# Patient Record
Sex: Female | Born: 1975 | Race: White | Hispanic: No | Marital: Single | Smoking: Former smoker
Health system: Southern US, Community
[De-identification: ages and names within clinical notes are randomized; demographics above are authoritative.]

## PROBLEM LIST (undated history)

## (undated) DIAGNOSIS — I739 Peripheral vascular disease, unspecified: Secondary | ICD-10-CM

## (undated) DIAGNOSIS — E785 Hyperlipidemia, unspecified: Secondary | ICD-10-CM

## (undated) DIAGNOSIS — I2510 Atherosclerotic heart disease of native coronary artery without angina pectoris: Secondary | ICD-10-CM

## (undated) DIAGNOSIS — E669 Obesity, unspecified: Secondary | ICD-10-CM

## (undated) DIAGNOSIS — K449 Diaphragmatic hernia without obstruction or gangrene: Secondary | ICD-10-CM

## (undated) DIAGNOSIS — Z9119 Patient's noncompliance with other medical treatment and regimen: Secondary | ICD-10-CM

## (undated) DIAGNOSIS — I6529 Occlusion and stenosis of unspecified carotid artery: Secondary | ICD-10-CM

## (undated) DIAGNOSIS — D649 Anemia, unspecified: Secondary | ICD-10-CM

## (undated) DIAGNOSIS — I10 Essential (primary) hypertension: Secondary | ICD-10-CM

## (undated) DIAGNOSIS — Z91199 Patient's noncompliance with other medical treatment and regimen due to unspecified reason: Secondary | ICD-10-CM

## (undated) DIAGNOSIS — I214 Non-ST elevation (NSTEMI) myocardial infarction: Secondary | ICD-10-CM

## (undated) DIAGNOSIS — K219 Gastro-esophageal reflux disease without esophagitis: Secondary | ICD-10-CM

## (undated) DIAGNOSIS — E119 Type 2 diabetes mellitus without complications: Secondary | ICD-10-CM

## (undated) DIAGNOSIS — T79A29A Traumatic compartment syndrome of unspecified lower extremity, initial encounter: Secondary | ICD-10-CM

## (undated) DIAGNOSIS — K2211 Ulcer of esophagus with bleeding: Secondary | ICD-10-CM

## (undated) HISTORY — DX: Patient's noncompliance with other medical treatment and regimen due to unspecified reason: Z91.199

## (undated) HISTORY — PX: CORONARY ARTERY BYPASS GRAFT: SHX141

## (undated) HISTORY — DX: Anemia, unspecified: D64.9

## (undated) HISTORY — DX: Gastro-esophageal reflux disease without esophagitis: K21.9

## (undated) HISTORY — DX: Peripheral vascular disease, unspecified: I73.9

## (undated) HISTORY — PX: HIP SURGERY: SHX245

## (undated) HISTORY — DX: Patient's noncompliance with other medical treatment and regimen: Z91.19

## (undated) HISTORY — DX: Hyperlipidemia, unspecified: E78.5

## (undated) HISTORY — PX: ABDOMINAL HYSTERECTOMY: SHX81

## (undated) HISTORY — DX: Ulcer of esophagus with bleeding: K22.11

## (undated) HISTORY — DX: Occlusion and stenosis of unspecified carotid artery: I65.29

## (undated) HISTORY — DX: Obesity, unspecified: E66.9

## (undated) HISTORY — DX: Atherosclerotic heart disease of native coronary artery without angina pectoris: I25.10

## (undated) HISTORY — DX: Diaphragmatic hernia without obstruction or gangrene: K44.9

## (undated) HISTORY — DX: Non-ST elevation (NSTEMI) myocardial infarction: I21.4

## (undated) HISTORY — PX: HERNIA REPAIR: SHX51

## (undated) HISTORY — PX: CORONARY ANGIOPLASTY WITH STENT PLACEMENT: SHX49

---

## 2004-10-15 DIAGNOSIS — I739 Peripheral vascular disease, unspecified: Secondary | ICD-10-CM

## 2004-10-15 DIAGNOSIS — I2510 Atherosclerotic heart disease of native coronary artery without angina pectoris: Secondary | ICD-10-CM

## 2004-10-15 HISTORY — DX: Atherosclerotic heart disease of native coronary artery without angina pectoris: I25.10

## 2004-10-15 HISTORY — DX: Peripheral vascular disease, unspecified: I73.9

## 2010-03-14 ENCOUNTER — Ambulatory Visit: Payer: Self-pay | Admitting: Cardiovascular Disease

## 2010-03-17 ENCOUNTER — Telehealth: Payer: Self-pay | Admitting: Cardiovascular Disease

## 2010-03-17 ENCOUNTER — Telehealth (INDEPENDENT_AMBULATORY_CARE_PROVIDER_SITE_OTHER): Payer: Self-pay | Admitting: *Deleted

## 2010-07-18 ENCOUNTER — Ambulatory Visit: Payer: Self-pay | Admitting: Cardiovascular Disease

## 2010-07-19 ENCOUNTER — Telehealth (INDEPENDENT_AMBULATORY_CARE_PROVIDER_SITE_OTHER): Payer: Self-pay | Admitting: Radiology

## 2010-07-19 ENCOUNTER — Telehealth: Payer: Self-pay | Admitting: Cardiovascular Disease

## 2010-07-20 ENCOUNTER — Telehealth (INDEPENDENT_AMBULATORY_CARE_PROVIDER_SITE_OTHER): Payer: Self-pay | Admitting: Radiology

## 2010-07-20 ENCOUNTER — Telehealth: Payer: Self-pay | Admitting: Cardiovascular Disease

## 2010-07-31 ENCOUNTER — Telehealth (INDEPENDENT_AMBULATORY_CARE_PROVIDER_SITE_OTHER): Payer: Self-pay | Admitting: Radiology

## 2010-08-04 ENCOUNTER — Telehealth: Payer: Self-pay | Admitting: Cardiovascular Disease

## 2010-08-22 ENCOUNTER — Telehealth (INDEPENDENT_AMBULATORY_CARE_PROVIDER_SITE_OTHER): Payer: Self-pay | Admitting: *Deleted

## 2010-08-22 ENCOUNTER — Telehealth: Payer: Self-pay | Admitting: Cardiovascular Disease

## 2010-08-23 ENCOUNTER — Telehealth (INDEPENDENT_AMBULATORY_CARE_PROVIDER_SITE_OTHER): Payer: Self-pay

## 2010-09-29 ENCOUNTER — Telehealth (INDEPENDENT_AMBULATORY_CARE_PROVIDER_SITE_OTHER): Payer: Self-pay | Admitting: *Deleted

## 2010-09-29 ENCOUNTER — Telehealth: Payer: Self-pay | Admitting: Cardiovascular Disease

## 2010-09-29 ENCOUNTER — Ambulatory Visit: Payer: Self-pay | Admitting: Cardiovascular Disease

## 2010-10-02 ENCOUNTER — Telehealth: Payer: Self-pay | Admitting: Cardiovascular Disease

## 2010-10-13 ENCOUNTER — Telehealth (INDEPENDENT_AMBULATORY_CARE_PROVIDER_SITE_OTHER): Payer: Self-pay | Admitting: *Deleted

## 2010-10-13 ENCOUNTER — Encounter: Payer: Self-pay | Admitting: Cardiovascular Disease

## 2010-10-13 DIAGNOSIS — R0989 Other specified symptoms and signs involving the circulatory and respiratory systems: Secondary | ICD-10-CM | POA: Insufficient documentation

## 2010-10-15 DIAGNOSIS — I214 Non-ST elevation (NSTEMI) myocardial infarction: Secondary | ICD-10-CM

## 2010-10-15 HISTORY — PX: CARDIAC CATHETERIZATION: SHX172

## 2010-10-15 HISTORY — DX: Non-ST elevation (NSTEMI) myocardial infarction: I21.4

## 2010-10-17 ENCOUNTER — Encounter: Payer: Self-pay | Admitting: Cardiovascular Disease

## 2010-10-17 ENCOUNTER — Inpatient Hospital Stay (HOSPITAL_COMMUNITY)
Admission: EM | Admit: 2010-10-17 | Discharge: 2010-10-29 | Payer: Self-pay | Source: Home / Self Care | Attending: Cardiology | Admitting: Cardiology

## 2010-10-17 ENCOUNTER — Ambulatory Visit: Admission: RE | Admit: 2010-10-17 | Discharge: 2010-10-17 | Payer: Self-pay | Source: Home / Self Care

## 2010-10-17 ENCOUNTER — Ambulatory Visit
Admission: RE | Admit: 2010-10-17 | Discharge: 2010-10-17 | Payer: Self-pay | Source: Home / Self Care | Attending: Cardiology | Admitting: Cardiology

## 2010-10-17 ENCOUNTER — Ambulatory Visit (HOSPITAL_COMMUNITY)
Admission: RE | Admit: 2010-10-17 | Discharge: 2010-10-17 | Payer: Self-pay | Source: Home / Self Care | Attending: Cardiovascular Disease | Admitting: Cardiovascular Disease

## 2010-10-17 ENCOUNTER — Encounter (HOSPITAL_COMMUNITY)
Admission: RE | Admit: 2010-10-17 | Discharge: 2010-11-14 | Payer: Self-pay | Source: Home / Self Care | Attending: Cardiovascular Disease | Admitting: Cardiovascular Disease

## 2010-10-17 ENCOUNTER — Ambulatory Visit (HOSPITAL_COMMUNITY): Admission: RE | Admit: 2010-10-17 | Discharge: 2010-10-17 | Payer: Self-pay | Source: Home / Self Care

## 2010-10-17 ENCOUNTER — Ambulatory Visit (HOSPITAL_COMMUNITY)
Admission: RE | Admit: 2010-10-17 | Discharge: 2010-10-17 | Payer: Self-pay | Source: Home / Self Care | Admitting: Cardiovascular Disease

## 2010-10-17 DIAGNOSIS — F17200 Nicotine dependence, unspecified, uncomplicated: Secondary | ICD-10-CM | POA: Insufficient documentation

## 2010-10-17 DIAGNOSIS — I2510 Atherosclerotic heart disease of native coronary artery without angina pectoris: Secondary | ICD-10-CM | POA: Insufficient documentation

## 2010-10-17 DIAGNOSIS — I739 Peripheral vascular disease, unspecified: Secondary | ICD-10-CM | POA: Insufficient documentation

## 2010-10-17 DIAGNOSIS — E119 Type 2 diabetes mellitus without complications: Secondary | ICD-10-CM | POA: Insufficient documentation

## 2010-10-17 DIAGNOSIS — R079 Chest pain, unspecified: Secondary | ICD-10-CM | POA: Insufficient documentation

## 2010-10-17 DIAGNOSIS — I10 Essential (primary) hypertension: Secondary | ICD-10-CM | POA: Insufficient documentation

## 2010-10-17 DIAGNOSIS — E785 Hyperlipidemia, unspecified: Secondary | ICD-10-CM | POA: Insufficient documentation

## 2010-10-18 LAB — COMPREHENSIVE METABOLIC PANEL
ALT: 19 U/L (ref 0–35)
AST: 31 U/L (ref 0–37)
Albumin: 3.3 g/dL — ABNORMAL LOW (ref 3.5–5.2)
Alkaline Phosphatase: 79 U/L (ref 39–117)
BUN: 6 mg/dL (ref 6–23)
CO2: 27 mEq/L (ref 19–32)
Calcium: 9.2 mg/dL (ref 8.4–10.5)
Chloride: 105 mEq/L (ref 96–112)
Creatinine, Ser: 0.59 mg/dL (ref 0.4–1.2)
GFR calc Af Amer: >60 mL/min (ref >60)
GFR calc non Af Amer: >60 mL/min (ref >60)
Glucose, Bld: 111 mg/dL — ABNORMAL HIGH (ref 70–99)
Potassium: 3.9 mEq/L (ref 3.5–5.1)
Sodium: 140 mEq/L (ref 135–145)
Total Bilirubin: 0.2 mg/dL — ABNORMAL LOW (ref 0.3–1.2)
Total Protein: 6.6 g/dL (ref 6.0–8.3)

## 2010-10-18 LAB — PROTIME-INR
INR: 0.9 (ref 0.00–1.49)
Prothrombin Time: 12.4 seconds (ref 11.6–15.2)

## 2010-10-18 LAB — GLUCOSE, CAPILLARY
Glucose-Capillary: 121 mg/dL — ABNORMAL HIGH (ref 70–99)
Glucose-Capillary: 114 mg/dL — ABNORMAL HIGH (ref 70–99)

## 2010-10-18 LAB — CBC
HCT: 36.8 % (ref 36.0–46.0)
Hemoglobin: 11.6 g/dL — ABNORMAL LOW (ref 12.0–15.0)
MCH: 27.8 pg (ref 26.0–34.0)
MCHC: 31.5 g/dL (ref 30.0–36.0)
MCV: 88.2 fL (ref 78.0–100.0)
Platelets: 221 10*3/uL (ref 150–400)
RBC: 4.17 MIL/uL (ref 3.87–5.11)
RDW: 13.6 % (ref 11.5–15.5)
WBC: 7.5 10*3/uL (ref 4.0–10.5)

## 2010-10-18 LAB — LIPID PANEL
Cholesterol: 129 mg/dL (ref 0–200)
HDL: 38 mg/dL — ABNORMAL LOW (ref >39)
LDL Cholesterol: 23 mg/dL (ref 0–99)
Total CHOL/HDL Ratio: 3.4 RATIO
Triglycerides: 342 mg/dL — ABNORMAL HIGH (ref <150)
VLDL: 68 mg/dL — ABNORMAL HIGH (ref 0–40)

## 2010-10-18 LAB — CARDIAC PANEL(CRET KIN+CKTOT+MB+TROPI)
CK, MB: 6.9 ng/mL (ref 0.3–4.0)
CK, MB: 5.8 ng/mL — ABNORMAL HIGH (ref 0.3–4.0)
Relative Index: 5.4 — ABNORMAL HIGH (ref 0.0–2.5)
Relative Index: 5.2 — ABNORMAL HIGH (ref 0.0–2.5)
Total CK: 127 U/L (ref 7–177)
Total CK: 112 U/L (ref 7–177)
Troponin I: 0.83 ng/mL (ref 0.00–0.06)
Troponin I: 0.63 ng/mL (ref 0.00–0.06)

## 2010-10-18 LAB — HEPARIN LEVEL (UNFRACTIONATED): Heparin Unfractionated: <0.1 IU/mL — ABNORMAL LOW (ref 0.30–0.70)

## 2010-10-18 LAB — TSH: TSH: 2.064 u[IU]/mL (ref 0.350–4.500)

## 2010-10-19 ENCOUNTER — Other Ambulatory Visit: Payer: Self-pay | Admitting: Cardiology

## 2010-10-19 LAB — BASIC METABOLIC PANEL
BUN: 5 mg/dL — ABNORMAL LOW (ref 6–23)
CO2: 28 mEq/L (ref 19–32)
Calcium: 9 mg/dL (ref 8.4–10.5)
Chloride: 107 mEq/L (ref 96–112)
Creatinine, Ser: 0.73 mg/dL (ref 0.4–1.2)
GFR calc Af Amer: >60 mL/min (ref >60)
GFR calc non Af Amer: >60 mL/min (ref >60)
Glucose, Bld: 117 mg/dL — ABNORMAL HIGH (ref 70–99)
Potassium: 3.9 mEq/L (ref 3.5–5.1)
Sodium: 141 mEq/L (ref 135–145)

## 2010-10-19 LAB — CBC
HCT: 35 % — ABNORMAL LOW (ref 36.0–46.0)
Hemoglobin: 11.4 g/dL — ABNORMAL LOW (ref 12.0–15.0)
MCH: 28.9 pg (ref 26.0–34.0)
MCHC: 32.6 g/dL (ref 30.0–36.0)
MCV: 88.6 fL (ref 78.0–100.0)
Platelets: 206 10*3/uL (ref 150–400)
RBC: 3.95 MIL/uL (ref 3.87–5.11)
RDW: 13.5 % (ref 11.5–15.5)
WBC: 8.5 10*3/uL (ref 4.0–10.5)

## 2010-10-19 LAB — GLUCOSE, CAPILLARY
Glucose-Capillary: 95 mg/dL (ref 70–99)
Glucose-Capillary: 148 mg/dL — ABNORMAL HIGH (ref 70–99)
Glucose-Capillary: 141 mg/dL — ABNORMAL HIGH (ref 70–99)
Glucose-Capillary: 117 mg/dL — ABNORMAL HIGH (ref 70–99)

## 2010-10-19 LAB — HEPARIN LEVEL (UNFRACTIONATED): Heparin Unfractionated: <0.1 IU/mL — ABNORMAL LOW (ref 0.30–0.70)

## 2010-10-20 ENCOUNTER — Encounter: Payer: Self-pay | Admitting: Cardiology

## 2010-10-20 ENCOUNTER — Encounter: Payer: Self-pay | Admitting: Thoracic Surgery (Cardiothoracic Vascular Surgery)

## 2010-10-20 LAB — CBC
HCT: 33.6 % — ABNORMAL LOW (ref 36.0–46.0)
Hemoglobin: 10.6 g/dL — ABNORMAL LOW (ref 12.0–15.0)
MCH: 28.2 pg (ref 26.0–34.0)
MCHC: 31.5 g/dL (ref 30.0–36.0)
MCV: 89.4 fL (ref 78.0–100.0)
Platelets: 204 10*3/uL (ref 150–400)
RBC: 3.76 MIL/uL — ABNORMAL LOW (ref 3.87–5.11)
RDW: 13.5 % (ref 11.5–15.5)
WBC: 9.1 10*3/uL (ref 4.0–10.5)

## 2010-10-20 LAB — HEPARIN LEVEL (UNFRACTIONATED): Heparin Unfractionated: <0.1 IU/mL — ABNORMAL LOW (ref 0.30–0.70)

## 2010-10-20 LAB — BASIC METABOLIC PANEL
BUN: 6 mg/dL (ref 6–23)
CO2: 27 mEq/L (ref 19–32)
Calcium: 9.2 mg/dL (ref 8.4–10.5)
Chloride: 102 mEq/L (ref 96–112)
Creatinine, Ser: 0.64 mg/dL (ref 0.4–1.2)
GFR calc Af Amer: >60 mL/min (ref >60)
GFR calc non Af Amer: >60 mL/min (ref >60)
Glucose, Bld: 113 mg/dL — ABNORMAL HIGH (ref 70–99)
Potassium: 3.8 mEq/L (ref 3.5–5.1)
Sodium: 138 mEq/L (ref 135–145)

## 2010-10-20 LAB — GLUCOSE, CAPILLARY
Glucose-Capillary: 138 mg/dL — ABNORMAL HIGH (ref 70–99)
Glucose-Capillary: 164 mg/dL — ABNORMAL HIGH (ref 70–99)
Glucose-Capillary: 114 mg/dL — ABNORMAL HIGH (ref 70–99)
Glucose-Capillary: 99 mg/dL (ref 70–99)
Glucose-Capillary: 142 mg/dL — ABNORMAL HIGH (ref 70–99)
Glucose-Capillary: 146 mg/dL — ABNORMAL HIGH (ref 70–99)

## 2010-10-24 ENCOUNTER — Ambulatory Visit: Admit: 2010-10-24 | Payer: Self-pay | Admitting: Cardiovascular Disease

## 2010-10-30 LAB — POCT I-STAT 3, ART BLOOD GAS (G3+)
Acid-Base Excess: 1 mmol/L (ref 0.0–2.0)
Acid-Base Excess: 5 mmol/L — ABNORMAL HIGH (ref 0.0–2.0)
Acid-base deficit: 1 mmol/L (ref 0.0–2.0)
Acid-base deficit: 15 mmol/L — ABNORMAL HIGH (ref 0.0–2.0)
Acid-base deficit: 4 mmol/L — ABNORMAL HIGH (ref 0.0–2.0)
Acid-base deficit: 4 mmol/L — ABNORMAL HIGH (ref 0.0–2.0)
Acid-base deficit: 2 mmol/L (ref 0.0–2.0)
Acid-base deficit: 8 mmol/L — ABNORMAL HIGH (ref 0.0–2.0)
Acid-base deficit: 1 mmol/L (ref 0.0–2.0)
Acid-base deficit: 5 mmol/L — ABNORMAL HIGH (ref 0.0–2.0)
Bicarbonate: 22.4 mEq/L (ref 20.0–24.0)
Bicarbonate: 14.1 mEq/L — ABNORMAL LOW (ref 20.0–24.0)
Bicarbonate: 21 mEq/L (ref 20.0–24.0)
Bicarbonate: 22.1 mEq/L (ref 20.0–24.0)
Bicarbonate: 23.6 mEq/L (ref 20.0–24.0)
Bicarbonate: 16.2 mEq/L — ABNORMAL LOW (ref 20.0–24.0)
Bicarbonate: 24.8 mEq/L — ABNORMAL HIGH (ref 20.0–24.0)
Bicarbonate: 22.8 mEq/L (ref 20.0–24.0)
Bicarbonate: 18.1 mEq/L — ABNORMAL LOW (ref 20.0–24.0)
Bicarbonate: 27.6 mEq/L — ABNORMAL HIGH (ref 20.0–24.0)
O2 Saturation: 100 %
O2 Saturation: 99 %
O2 Saturation: 100 %
O2 Saturation: 93 %
O2 Saturation: 97 %
O2 Saturation: 98 %
O2 Saturation: 92 %
O2 Saturation: 95 %
O2 Saturation: 94 %
O2 Saturation: 91 %
Patient temperature: 36.8
Patient temperature: 98
Patient temperature: 38.6
Patient temperature: 38.1
Patient temperature: 37.3
Patient temperature: 38
TCO2: 23 mmol/L (ref 0–100)
TCO2: 16 mmol/L (ref 0–100)
TCO2: 22 mmol/L (ref 0–100)
TCO2: 23 mmol/L (ref 0–100)
TCO2: 25 mmol/L (ref 0–100)
TCO2: 17 mmol/L (ref 0–100)
TCO2: 26 mmol/L (ref 0–100)
TCO2: 24 mmol/L (ref 0–100)
TCO2: 19 mmol/L (ref 0–100)
TCO2: 29 mmol/L (ref 0–100)
pCO2 arterial: 32.6 mmHg — ABNORMAL LOW (ref 35.0–45.0)
pCO2 arterial: 49.8 mmHg — ABNORMAL HIGH (ref 35.0–45.0)
pCO2 arterial: 37.2 mmHg (ref 35.0–45.0)
pCO2 arterial: 43.4 mmHg (ref 35.0–45.0)
pCO2 arterial: 43.2 mmHg (ref 35.0–45.0)
pCO2 arterial: 25.4 mmHg — ABNORMAL LOW (ref 35.0–45.0)
pCO2 arterial: 39.8 mmHg (ref 35.0–45.0)
pCO2 arterial: 33.6 mmHg — ABNORMAL LOW (ref 35.0–45.0)
pCO2 arterial: 27.3 mmHg — ABNORMAL LOW (ref 35.0–45.0)
pCO2 arterial: 35.6 mmHg (ref 35.0–45.0)
pH, Arterial: 7.446 — ABNORMAL HIGH (ref 7.350–7.400)
pH, Arterial: 7.06 — CL (ref 7.350–7.400)
pH, Arterial: 7.315 — ABNORMAL LOW (ref 7.350–7.400)
pH, Arterial: 7.359 (ref 7.350–7.400)
pH, Arterial: 7.345 — ABNORMAL LOW (ref 7.350–7.400)
pH, Arterial: 7.41 — ABNORMAL HIGH (ref 7.350–7.400)
pH, Arterial: 7.409 — ABNORMAL HIGH (ref 7.350–7.400)
pH, Arterial: 7.444 — ABNORMAL HIGH (ref 7.350–7.400)
pH, Arterial: 7.431 — ABNORMAL HIGH (ref 7.350–7.400)
pH, Arterial: 7.502 — ABNORMAL HIGH (ref 7.350–7.400)
pO2, Arterial: 345 mmHg — ABNORMAL HIGH (ref 80.0–100.0)
pO2, Arterial: 177 mmHg — ABNORMAL HIGH (ref 80.0–100.0)
pO2, Arterial: 392 mmHg — ABNORMAL HIGH (ref 80.0–100.0)
pO2, Arterial: 71 mmHg — ABNORMAL LOW (ref 80.0–100.0)
pO2, Arterial: 92 mmHg (ref 80.0–100.0)
pO2, Arterial: 100 mmHg (ref 80.0–100.0)
pO2, Arterial: 70 mmHg — ABNORMAL LOW (ref 80.0–100.0)
pO2, Arterial: 78 mmHg — ABNORMAL LOW (ref 80.0–100.0)
pO2, Arterial: 66 mmHg — ABNORMAL LOW (ref 80.0–100.0)
pO2, Arterial: 58 mmHg — ABNORMAL LOW (ref 80.0–100.0)

## 2010-10-30 LAB — BASIC METABOLIC PANEL
BUN: 8 mg/dL (ref 6–23)
BUN: 14 mg/dL (ref 6–23)
BUN: 8 mg/dL (ref 6–23)
BUN: 12 mg/dL (ref 6–23)
BUN: 14 mg/dL (ref 6–23)
CO2: 29 mEq/L (ref 19–32)
CO2: 25 mEq/L (ref 19–32)
CO2: 23 mEq/L (ref 19–32)
CO2: 30 mEq/L (ref 19–32)
CO2: 28 mEq/L (ref 19–32)
Calcium: 9.6 mg/dL (ref 8.4–10.5)
Calcium: 9.5 mg/dL (ref 8.4–10.5)
Calcium: 8.5 mg/dL (ref 8.4–10.5)
Calcium: 8.5 mg/dL (ref 8.4–10.5)
Calcium: 8.9 mg/dL (ref 8.4–10.5)
Chloride: 97 mEq/L (ref 96–112)
Chloride: 99 mEq/L (ref 96–112)
Chloride: 104 mEq/L (ref 96–112)
Chloride: 102 mEq/L (ref 96–112)
Chloride: 100 mEq/L (ref 96–112)
Creatinine, Ser: 0.65 mg/dL (ref 0.4–1.2)
Creatinine, Ser: 0.64 mg/dL (ref 0.4–1.2)
Creatinine, Ser: 0.69 mg/dL (ref 0.4–1.2)
Creatinine, Ser: 0.65 mg/dL (ref 0.4–1.2)
Creatinine, Ser: 0.69 mg/dL (ref 0.4–1.2)
GFR calc Af Amer: >60 mL/min (ref >60)
GFR calc Af Amer: >60 mL/min (ref >60)
GFR calc Af Amer: >60 mL/min (ref >60)
GFR calc Af Amer: >60 mL/min (ref >60)
GFR calc Af Amer: >60 mL/min (ref >60)
GFR calc non Af Amer: >60 mL/min (ref >60)
GFR calc non Af Amer: >60 mL/min (ref >60)
GFR calc non Af Amer: >60 mL/min (ref >60)
GFR calc non Af Amer: >60 mL/min (ref >60)
GFR calc non Af Amer: >60 mL/min (ref >60)
Glucose, Bld: 153 mg/dL — ABNORMAL HIGH (ref 70–99)
Glucose, Bld: 178 mg/dL — ABNORMAL HIGH (ref 70–99)
Glucose, Bld: 205 mg/dL — ABNORMAL HIGH (ref 70–99)
Glucose, Bld: 113 mg/dL — ABNORMAL HIGH (ref 70–99)
Glucose, Bld: 79 mg/dL (ref 70–99)
Potassium: 3.8 mEq/L (ref 3.5–5.1)
Potassium: 5 mEq/L (ref 3.5–5.1)
Potassium: 3.9 mEq/L (ref 3.5–5.1)
Potassium: 3.5 mEq/L (ref 3.5–5.1)
Potassium: 4 mEq/L (ref 3.5–5.1)
Sodium: 137 mEq/L (ref 135–145)
Sodium: 134 mEq/L — ABNORMAL LOW (ref 135–145)
Sodium: 136 mEq/L (ref 135–145)
Sodium: 140 mEq/L (ref 135–145)
Sodium: 138 mEq/L (ref 135–145)

## 2010-10-30 LAB — GLUCOSE, CAPILLARY
Glucose-Capillary: 159 mg/dL — ABNORMAL HIGH (ref 70–99)
Glucose-Capillary: 121 mg/dL — ABNORMAL HIGH (ref 70–99)
Glucose-Capillary: 138 mg/dL — ABNORMAL HIGH (ref 70–99)
Glucose-Capillary: 155 mg/dL — ABNORMAL HIGH (ref 70–99)
Glucose-Capillary: 114 mg/dL — ABNORMAL HIGH (ref 70–99)
Glucose-Capillary: 124 mg/dL — ABNORMAL HIGH (ref 70–99)
Glucose-Capillary: 145 mg/dL — ABNORMAL HIGH (ref 70–99)
Glucose-Capillary: 166 mg/dL — ABNORMAL HIGH (ref 70–99)
Glucose-Capillary: 104 mg/dL — ABNORMAL HIGH (ref 70–99)
Glucose-Capillary: 117 mg/dL — ABNORMAL HIGH (ref 70–99)
Glucose-Capillary: 127 mg/dL — ABNORMAL HIGH (ref 70–99)
Glucose-Capillary: 142 mg/dL — ABNORMAL HIGH (ref 70–99)
Glucose-Capillary: 142 mg/dL — ABNORMAL HIGH (ref 70–99)
Glucose-Capillary: 149 mg/dL — ABNORMAL HIGH (ref 70–99)
Glucose-Capillary: 156 mg/dL — ABNORMAL HIGH (ref 70–99)
Glucose-Capillary: 168 mg/dL — ABNORMAL HIGH (ref 70–99)
Glucose-Capillary: 190 mg/dL — ABNORMAL HIGH (ref 70–99)
Glucose-Capillary: 201 mg/dL — ABNORMAL HIGH (ref 70–99)
Glucose-Capillary: 204 mg/dL — ABNORMAL HIGH (ref 70–99)
Glucose-Capillary: 207 mg/dL — ABNORMAL HIGH (ref 70–99)
Glucose-Capillary: 81 mg/dL (ref 70–99)
Glucose-Capillary: 110 mg/dL — ABNORMAL HIGH (ref 70–99)
Glucose-Capillary: 124 mg/dL — ABNORMAL HIGH (ref 70–99)
Glucose-Capillary: 130 mg/dL — ABNORMAL HIGH (ref 70–99)
Glucose-Capillary: 144 mg/dL — ABNORMAL HIGH (ref 70–99)
Glucose-Capillary: 152 mg/dL — ABNORMAL HIGH (ref 70–99)
Glucose-Capillary: 160 mg/dL — ABNORMAL HIGH (ref 70–99)
Glucose-Capillary: 179 mg/dL — ABNORMAL HIGH (ref 70–99)
Glucose-Capillary: 192 mg/dL — ABNORMAL HIGH (ref 70–99)
Glucose-Capillary: 195 mg/dL — ABNORMAL HIGH (ref 70–99)
Glucose-Capillary: 196 mg/dL — ABNORMAL HIGH (ref 70–99)
Glucose-Capillary: 201 mg/dL — ABNORMAL HIGH (ref 70–99)
Glucose-Capillary: 204 mg/dL — ABNORMAL HIGH (ref 70–99)
Glucose-Capillary: 209 mg/dL — ABNORMAL HIGH (ref 70–99)
Glucose-Capillary: 211 mg/dL — ABNORMAL HIGH (ref 70–99)
Glucose-Capillary: 110 mg/dL — ABNORMAL HIGH (ref 70–99)
Glucose-Capillary: 136 mg/dL — ABNORMAL HIGH (ref 70–99)
Glucose-Capillary: 138 mg/dL — ABNORMAL HIGH (ref 70–99)
Glucose-Capillary: 147 mg/dL — ABNORMAL HIGH (ref 70–99)
Glucose-Capillary: 78 mg/dL (ref 70–99)
Glucose-Capillary: 87 mg/dL (ref 70–99)
Glucose-Capillary: 92 mg/dL (ref 70–99)
Glucose-Capillary: 93 mg/dL (ref 70–99)
Glucose-Capillary: 93 mg/dL (ref 70–99)
Glucose-Capillary: 98 mg/dL (ref 70–99)
Glucose-Capillary: 127 mg/dL — ABNORMAL HIGH (ref 70–99)

## 2010-10-30 LAB — CBC
HCT: 33.2 % — ABNORMAL LOW (ref 36.0–46.0)
HCT: 36 % (ref 36.0–46.0)
HCT: 37.7 % (ref 36.0–46.0)
HCT: 36.8 % (ref 36.0–46.0)
HCT: 31.2 % — ABNORMAL LOW (ref 36.0–46.0)
HCT: 26.7 % — ABNORMAL LOW (ref 36.0–46.0)
HCT: 26.9 % — ABNORMAL LOW (ref 36.0–46.0)
HCT: 23.8 % — ABNORMAL LOW (ref 36.0–46.0)
HCT: 20.4 % — ABNORMAL LOW (ref 36.0–46.0)
HCT: 21.1 % — ABNORMAL LOW (ref 36.0–46.0)
HCT: 25.6 % — ABNORMAL LOW (ref 36.0–46.0)
Hemoglobin: 10.8 g/dL — ABNORMAL LOW (ref 12.0–15.0)
Hemoglobin: 11.5 g/dL — ABNORMAL LOW (ref 12.0–15.0)
Hemoglobin: 12.2 g/dL (ref 12.0–15.0)
Hemoglobin: 11.9 g/dL — ABNORMAL LOW (ref 12.0–15.0)
Hemoglobin: 10.7 g/dL — ABNORMAL LOW (ref 12.0–15.0)
Hemoglobin: 9.3 g/dL — ABNORMAL LOW (ref 12.0–15.0)
Hemoglobin: 9.3 g/dL — ABNORMAL LOW (ref 12.0–15.0)
Hemoglobin: 8.3 g/dL — ABNORMAL LOW (ref 12.0–15.0)
Hemoglobin: 7.1 g/dL — ABNORMAL LOW (ref 12.0–15.0)
Hemoglobin: 7.2 g/dL — ABNORMAL LOW (ref 12.0–15.0)
Hemoglobin: 8.8 g/dL — ABNORMAL LOW (ref 12.0–15.0)
MCH: 28.6 pg (ref 26.0–34.0)
MCH: 28 pg (ref 26.0–34.0)
MCH: 28.2 pg (ref 26.0–34.0)
MCH: 28.2 pg (ref 26.0–34.0)
MCH: 30.1 pg (ref 26.0–34.0)
MCH: 30 pg (ref 26.0–34.0)
MCH: 29.9 pg (ref 26.0–34.0)
MCH: 30.1 pg (ref 26.0–34.0)
MCH: 30 pg (ref 26.0–34.0)
MCH: 30.1 pg (ref 26.0–34.0)
MCH: 30.7 pg (ref 26.0–34.0)
MCHC: 32.5 g/dL (ref 30.0–36.0)
MCHC: 31.9 g/dL (ref 30.0–36.0)
MCHC: 32.4 g/dL (ref 30.0–36.0)
MCHC: 32.3 g/dL (ref 30.0–36.0)
MCHC: 34.3 g/dL (ref 30.0–36.0)
MCHC: 34.8 g/dL (ref 30.0–36.0)
MCHC: 34.6 g/dL (ref 30.0–36.0)
MCHC: 34.9 g/dL (ref 30.0–36.0)
MCHC: 34.8 g/dL (ref 30.0–36.0)
MCHC: 34.1 g/dL (ref 30.0–36.0)
MCHC: 34.4 g/dL (ref 30.0–36.0)
MCV: 87.8 fL (ref 78.0–100.0)
MCV: 87.6 fL (ref 78.0–100.0)
MCV: 87.3 fL (ref 78.0–100.0)
MCV: 87.2 fL (ref 78.0–100.0)
MCV: 87.6 fL (ref 78.0–100.0)
MCV: 86.1 fL (ref 78.0–100.0)
MCV: 86.5 fL (ref 78.0–100.0)
MCV: 86.2 fL (ref 78.0–100.0)
MCV: 86.1 fL (ref 78.0–100.0)
MCV: 88.3 fL (ref 78.0–100.0)
MCV: 89.2 fL (ref 78.0–100.0)
Platelets: 208 10*3/uL (ref 150–400)
Platelets: 259 10*3/uL (ref 150–400)
Platelets: 282 10*3/uL (ref 150–400)
Platelets: 297 10*3/uL (ref 150–400)
Platelets: 119 10*3/uL — ABNORMAL LOW (ref 150–400)
Platelets: 139 10*3/uL — ABNORMAL LOW (ref 150–400)
Platelets: 149 10*3/uL — ABNORMAL LOW (ref 150–400)
Platelets: 152 10*3/uL (ref 150–400)
Platelets: 139 10*3/uL — ABNORMAL LOW (ref 150–400)
Platelets: 177 10*3/uL (ref 150–400)
Platelets: 240 10*3/uL (ref 150–400)
RBC: 3.78 MIL/uL — ABNORMAL LOW (ref 3.87–5.11)
RBC: 4.11 MIL/uL (ref 3.87–5.11)
RBC: 4.32 MIL/uL (ref 3.87–5.11)
RBC: 4.22 MIL/uL (ref 3.87–5.11)
RBC: 3.56 MIL/uL — ABNORMAL LOW (ref 3.87–5.11)
RBC: 3.1 MIL/uL — ABNORMAL LOW (ref 3.87–5.11)
RBC: 3.11 MIL/uL — ABNORMAL LOW (ref 3.87–5.11)
RBC: 2.76 MIL/uL — ABNORMAL LOW (ref 3.87–5.11)
RBC: 2.37 MIL/uL — ABNORMAL LOW (ref 3.87–5.11)
RBC: 2.39 MIL/uL — ABNORMAL LOW (ref 3.87–5.11)
RBC: 2.87 MIL/uL — ABNORMAL LOW (ref 3.87–5.11)
RDW: 13.3 % (ref 11.5–15.5)
RDW: 13.1 % (ref 11.5–15.5)
RDW: 13.1 % (ref 11.5–15.5)
RDW: 13 % (ref 11.5–15.5)
RDW: 13.1 % (ref 11.5–15.5)
RDW: 13.5 % (ref 11.5–15.5)
RDW: 13.7 % (ref 11.5–15.5)
RDW: 13.8 % (ref 11.5–15.5)
RDW: 13.7 % (ref 11.5–15.5)
RDW: 13.8 % (ref 11.5–15.5)
RDW: 14 % (ref 11.5–15.5)
WBC: 9.2 10*3/uL (ref 4.0–10.5)
WBC: 9.7 10*3/uL (ref 4.0–10.5)
WBC: 11.7 10*3/uL — ABNORMAL HIGH (ref 4.0–10.5)
WBC: 11.1 10*3/uL — ABNORMAL HIGH (ref 4.0–10.5)
WBC: 11.8 10*3/uL — ABNORMAL HIGH (ref 4.0–10.5)
WBC: 9.8 10*3/uL (ref 4.0–10.5)
WBC: 11.6 10*3/uL — ABNORMAL HIGH (ref 4.0–10.5)
WBC: 14.5 10*3/uL — ABNORMAL HIGH (ref 4.0–10.5)
WBC: 16.8 10*3/uL — ABNORMAL HIGH (ref 4.0–10.5)
WBC: 12.8 10*3/uL — ABNORMAL HIGH (ref 4.0–10.5)
WBC: 11.7 10*3/uL — ABNORMAL HIGH (ref 4.0–10.5)

## 2010-10-30 LAB — POCT I-STAT 4, (NA,K, GLUC, HGB,HCT)
Glucose, Bld: 140 mg/dL — ABNORMAL HIGH (ref 70–99)
Glucose, Bld: 127 mg/dL — ABNORMAL HIGH (ref 70–99)
Glucose, Bld: 120 mg/dL — ABNORMAL HIGH (ref 70–99)
Glucose, Bld: 195 mg/dL — ABNORMAL HIGH (ref 70–99)
Glucose, Bld: 206 mg/dL — ABNORMAL HIGH (ref 70–99)
Glucose, Bld: 264 mg/dL — ABNORMAL HIGH (ref 70–99)
Glucose, Bld: 277 mg/dL — ABNORMAL HIGH (ref 70–99)
Glucose, Bld: 308 mg/dL — ABNORMAL HIGH (ref 70–99)
Glucose, Bld: 237 mg/dL — ABNORMAL HIGH (ref 70–99)
HCT: 32 % — ABNORMAL LOW (ref 36.0–46.0)
HCT: 27 % — ABNORMAL LOW (ref 36.0–46.0)
HCT: 17 % — ABNORMAL LOW (ref 36.0–46.0)
HCT: 23 % — ABNORMAL LOW (ref 36.0–46.0)
HCT: 26 % — ABNORMAL LOW (ref 36.0–46.0)
HCT: 20 % — ABNORMAL LOW (ref 36.0–46.0)
HCT: 33 % — ABNORMAL LOW (ref 36.0–46.0)
HCT: 35 % — ABNORMAL LOW (ref 36.0–46.0)
HCT: 32 % — ABNORMAL LOW (ref 36.0–46.0)
Hemoglobin: 10.9 g/dL — ABNORMAL LOW (ref 12.0–15.0)
Hemoglobin: 9.2 g/dL — ABNORMAL LOW (ref 12.0–15.0)
Hemoglobin: 5.8 g/dL — CL (ref 12.0–15.0)
Hemoglobin: 7.8 g/dL — ABNORMAL LOW (ref 12.0–15.0)
Hemoglobin: 8.8 g/dL — ABNORMAL LOW (ref 12.0–15.0)
Hemoglobin: 6.8 g/dL — CL (ref 12.0–15.0)
Hemoglobin: 11.2 g/dL — ABNORMAL LOW (ref 12.0–15.0)
Hemoglobin: 11.9 g/dL — ABNORMAL LOW (ref 12.0–15.0)
Hemoglobin: 10.9 g/dL — ABNORMAL LOW (ref 12.0–15.0)
Potassium: 4.2 mEq/L (ref 3.5–5.1)
Potassium: 4.1 mEq/L (ref 3.5–5.1)
Potassium: 3.8 mEq/L (ref 3.5–5.1)
Potassium: 5.5 mEq/L — ABNORMAL HIGH (ref 3.5–5.1)
Potassium: 5.6 mEq/L — ABNORMAL HIGH (ref 3.5–5.1)
Potassium: 4.7 mEq/L (ref 3.5–5.1)
Potassium: 3.8 mEq/L (ref 3.5–5.1)
Potassium: 3.9 mEq/L (ref 3.5–5.1)
Potassium: 3.5 mEq/L (ref 3.5–5.1)
Sodium: 136 mEq/L (ref 135–145)
Sodium: 136 mEq/L (ref 135–145)
Sodium: 129 mEq/L — ABNORMAL LOW (ref 135–145)
Sodium: 130 mEq/L — ABNORMAL LOW (ref 135–145)
Sodium: 132 mEq/L — ABNORMAL LOW (ref 135–145)
Sodium: 160 mEq/L — ABNORMAL HIGH (ref 135–145)
Sodium: 139 mEq/L (ref 135–145)
Sodium: 139 mEq/L (ref 135–145)
Sodium: 141 mEq/L (ref 135–145)

## 2010-10-30 LAB — COMPREHENSIVE METABOLIC PANEL
ALT: 16 U/L (ref 0–35)
ALT: 12 U/L (ref 0–35)
AST: 23 U/L (ref 0–37)
AST: 27 U/L (ref 0–37)
Albumin: 3.5 g/dL (ref 3.5–5.2)
Albumin: 2.9 g/dL — ABNORMAL LOW (ref 3.5–5.2)
Alkaline Phosphatase: 85 U/L (ref 39–117)
Alkaline Phosphatase: 34 U/L — ABNORMAL LOW (ref 39–117)
BUN: 6 mg/dL (ref 6–23)
BUN: 11 mg/dL (ref 6–23)
CO2: 28 mEq/L (ref 19–32)
CO2: 29 mEq/L (ref 19–32)
Calcium: 9.8 mg/dL (ref 8.4–10.5)
Calcium: 8.7 mg/dL (ref 8.4–10.5)
Chloride: 97 mEq/L (ref 96–112)
Chloride: 100 mEq/L (ref 96–112)
Creatinine, Ser: 0.64 mg/dL (ref 0.4–1.2)
Creatinine, Ser: 0.71 mg/dL (ref 0.4–1.2)
GFR calc Af Amer: >60 mL/min (ref >60)
GFR calc Af Amer: >60 mL/min (ref >60)
GFR calc non Af Amer: >60 mL/min (ref >60)
GFR calc non Af Amer: >60 mL/min (ref >60)
Glucose, Bld: 153 mg/dL — ABNORMAL HIGH (ref 70–99)
Glucose, Bld: 151 mg/dL — ABNORMAL HIGH (ref 70–99)
Potassium: 4.5 mEq/L (ref 3.5–5.1)
Potassium: 3.5 mEq/L (ref 3.5–5.1)
Sodium: 135 mEq/L (ref 135–145)
Sodium: 138 mEq/L (ref 135–145)
Total Bilirubin: 0.4 mg/dL (ref 0.3–1.2)
Total Bilirubin: 0.3 mg/dL (ref 0.3–1.2)
Total Protein: 7.6 g/dL (ref 6.0–8.3)
Total Protein: 5.4 g/dL — ABNORMAL LOW (ref 6.0–8.3)

## 2010-10-30 LAB — TYPE AND SCREEN
ABO/RH(D): A POS
Antibody Screen: NEGATIVE
Unit division: 0
Unit division: 0
Unit division: 0
Unit division: 0
Unit division: 0
Unit division: 0
Unit division: 0
Unit division: 0
Unit division: 0
Unit division: 0

## 2010-10-30 LAB — POCT I-STAT, CHEM 8
BUN: 8 mg/dL (ref 6–23)
BUN: 7 mg/dL (ref 6–23)
Calcium, Ion: 1.17 mmol/L (ref 1.12–1.32)
Calcium, Ion: 1.12 mmol/L (ref 1.12–1.32)
Chloride: 106 mEq/L (ref 96–112)
Chloride: 98 mEq/L (ref 96–112)
Creatinine, Ser: 0.5 mg/dL (ref 0.4–1.2)
Creatinine, Ser: 0.6 mg/dL (ref 0.4–1.2)
Glucose, Bld: 119 mg/dL — ABNORMAL HIGH (ref 70–99)
Glucose, Bld: 226 mg/dL — ABNORMAL HIGH (ref 70–99)
HCT: 27 % — ABNORMAL LOW (ref 36.0–46.0)
HCT: 24 % — ABNORMAL LOW (ref 36.0–46.0)
Hemoglobin: 9.2 g/dL — ABNORMAL LOW (ref 12.0–15.0)
Hemoglobin: 8.2 g/dL — ABNORMAL LOW (ref 12.0–15.0)
Potassium: 3.5 mEq/L (ref 3.5–5.1)
Potassium: 3 mEq/L — ABNORMAL LOW (ref 3.5–5.1)
Sodium: 140 mEq/L (ref 135–145)
Sodium: 139 mEq/L (ref 135–145)
TCO2: 24 mmol/L (ref 0–100)
TCO2: 27 mmol/L (ref 0–100)

## 2010-10-30 LAB — PROTIME-INR
INR: 0.92 (ref 0.00–1.49)
INR: 1.46 (ref 0.00–1.49)
Prothrombin Time: 12.6 seconds (ref 11.6–15.2)
Prothrombin Time: 17.9 seconds — ABNORMAL HIGH (ref 11.6–15.2)

## 2010-10-30 LAB — PREPARE FRESH FROZEN PLASMA
Unit division: 0
Unit division: 0

## 2010-10-30 LAB — BLOOD GAS, ARTERIAL
Acid-Base Excess: 4.1 mmol/L — ABNORMAL HIGH (ref 0.0–2.0)
Bicarbonate: 28.6 mEq/L — ABNORMAL HIGH (ref 20.0–24.0)
Drawn by: 205171
FIO2: 0.21 %
O2 Saturation: 92 %
Patient temperature: 98.6
TCO2: 30 mmol/L (ref 0–100)
pCO2 arterial: 46.5 mmHg — ABNORMAL HIGH (ref 35.0–45.0)
pH, Arterial: 7.406 — ABNORMAL HIGH (ref 7.350–7.400)
pO2, Arterial: 68 mmHg — ABNORMAL LOW (ref 80.0–100.0)

## 2010-10-30 LAB — URINALYSIS, ROUTINE W REFLEX MICROSCOPIC
Bilirubin Urine: NEGATIVE
Hgb urine dipstick: NEGATIVE
Ketones, ur: NEGATIVE mg/dL
Nitrite: NEGATIVE
Protein, ur: NEGATIVE mg/dL
Specific Gravity, Urine: 1.011 (ref 1.005–1.030)
Urine Glucose, Fasting: NEGATIVE mg/dL
Urobilinogen, UA: 0.2 mg/dL (ref 0.0–1.0)
pH: 7.5 (ref 5.0–8.0)

## 2010-10-30 LAB — PREPARE PLATELETS
Unit division: 0
Unit division: 0

## 2010-10-30 LAB — CROSSMATCH
ABO/RH(D): A POS
Antibody Screen: NEGATIVE
Unit division: 0
Unit division: 0

## 2010-10-30 LAB — ABO/RH: ABO/RH(D): A POS

## 2010-10-30 LAB — MAGNESIUM
Magnesium: 2.2 mg/dL (ref 1.5–2.5)
Magnesium: 1.9 mg/dL (ref 1.5–2.5)
Magnesium: 1.7 mg/dL (ref 1.5–2.5)

## 2010-10-30 LAB — HEMOGLOBIN A1C
Hgb A1c MFr Bld: 6.6 % — ABNORMAL HIGH (ref <5.7)
Mean Plasma Glucose: 143 mg/dL — ABNORMAL HIGH (ref <117)

## 2010-10-30 LAB — HEPARIN LEVEL (UNFRACTIONATED): Heparin Unfractionated: <0.1 IU/mL — ABNORMAL LOW (ref 0.30–0.70)

## 2010-10-30 LAB — CREATININE, SERUM
Creatinine, Ser: 0.6 mg/dL (ref 0.4–1.2)
Creatinine, Ser: 0.66 mg/dL (ref 0.4–1.2)
GFR calc Af Amer: >60 mL/min (ref >60)
GFR calc Af Amer: >60 mL/min (ref >60)
GFR calc non Af Amer: >60 mL/min (ref >60)
GFR calc non Af Amer: >60 mL/min (ref >60)

## 2010-10-30 LAB — PLATELET INHIBITION P2Y12
P2Y12 % Inhibition: 0 %
Platelet Function  P2Y12: 261 [PRU] (ref 194–418)
Platelet Function Baseline: 250 [PRU] (ref 194–418)

## 2010-10-30 LAB — PLATELET COUNT: Platelets: 172 10*3/uL (ref 150–400)

## 2010-10-30 LAB — APTT
aPTT: 27 seconds (ref 24–37)
aPTT: 35 seconds (ref 24–37)

## 2010-10-30 LAB — HEMOGLOBIN AND HEMATOCRIT, BLOOD
HCT: 25.8 % — ABNORMAL LOW (ref 36.0–46.0)
Hemoglobin: 8.6 g/dL — ABNORMAL LOW (ref 12.0–15.0)

## 2010-10-30 LAB — GLUCOSE, POCT (MANUAL RESULT ENTRY)
Glucose, Bld: 124 mg/dL — ABNORMAL HIGH (ref 70–99)
Operator id: 125961

## 2010-10-30 LAB — PREPARE RBC (CROSSMATCH)

## 2010-11-01 LAB — POCT I-STAT 7, (LYTES, BLD GAS, ICA,H+H)
Acid-base deficit: 3 mmol/L — ABNORMAL HIGH (ref 0.0–2.0)
Bicarbonate: 22.1 mEq/L (ref 20.0–24.0)
Calcium, Ion: 1.21 mmol/L (ref 1.12–1.32)
HCT: 30 % — ABNORMAL LOW (ref 36.0–46.0)
Hemoglobin: 10.2 g/dL — ABNORMAL LOW (ref 12.0–15.0)
O2 Saturation: 99 %
Patient temperature: 36.6
Potassium: 3.4 mEq/L — ABNORMAL LOW (ref 3.5–5.1)
Sodium: 141 mEq/L (ref 135–145)
TCO2: 23 mmol/L (ref 0–100)
pCO2 arterial: 39.6 mmHg (ref 35.0–45.0)
pH, Arterial: 7.353 (ref 7.350–7.400)
pO2, Arterial: 144 mmHg — ABNORMAL HIGH (ref 80.0–100.0)

## 2010-11-01 LAB — GLUCOSE, POCT (MANUAL RESULT ENTRY)
Glucose, Bld: 249 mg/dL — ABNORMAL HIGH (ref 70–99)
Operator id: 238831

## 2010-11-06 ENCOUNTER — Ambulatory Visit
Admission: RE | Admit: 2010-11-06 | Discharge: 2010-11-06 | Payer: Self-pay | Source: Home / Self Care | Attending: Thoracic Surgery (Cardiothoracic Vascular Surgery) | Admitting: Thoracic Surgery (Cardiothoracic Vascular Surgery)

## 2010-11-10 NOTE — Procedures (Signed)
Ann Hawkins, Ann Hawkins                  ACCOUNT NO.:  [id number]  MEDICAL RECORD NO.:  [id number]          PATIENT TYPE:  OUT  LOCATION:  NINV                         FACILITY:  [facility]  PHYSICIAN:  Lorine Bears, MD     DATE OF BIRTH:  1976-04-21  DATE OF PROCEDURE:  10/17/2009 DATE OF DISCHARGE:  10/17/2010                           CARDIAC CATHETERIZATION   PROCEDURES PERFORMED: 1. Left heart catheterization. 2. Coronary angiography. 3. Left ventricular angiography. 4. SVG angiography. 5. LIMA angiography.  INDICATIONS AND CLINICAL HISTORY:  Ms. Zertuche is very complicated 35 year old female with known history of coronary artery disease status post coronary artery bypass graft surgery in 2006 with multiple subsequent angioplasty and stent placements.  She also has peripheral vascular disease status post aortobifemoral bypass and possibly fem-pop bypass bilaterally as well.  Her other risk factors include prolonged history of diabetes, hyperlipidemia, and tobacco use.  She was seen in the outpatient clinic for chest pain, and missed few appointments for her stress test.  She finally made her arranged appointment for nuclear stress test, but started having chest pain during the procedure.  Stress test showed evidence of inferolateral ischemia.  Due to her continued symptoms of chest pain, it was decided to admit her to the hospital. She subsequently ruled in for a small non-ST elevation myocardial infarction.  Risks, benefits, and alternatives were discussed with the patient.  This was known to be a very complex vascular case.  STUDY DETAILS:  Initially, I attempted to go through the right femoral artery:  The area was anesthetized with 1% lidocaine.  The pulse was very faint.  I was able to get inside the artery.  However, the wire did not pass to the distal aorta.  I placed the sheath at that time and performed angiography, which showed that possibly we were in the native vessel  and not bypass graft.  Initial history we got was that she had bilateral femoral-popliteal bypass, but it seems from angiography that she also has aortobifemoral bypass.  At this time, we decided to switch to an upper extremity approach.  Due to the presence of LIMA graft, we decided to go through the left side.  The left radial pulse was not palpable.  I attempted access through that area, but could not get any blood return.  Thus, we switched to the left brachial approach.  I attempted multiple times and could not get accessed.  Then, Dr. Excell Seltzer assisted in obtaining access through the left brachial area and was able to place a 5-French sheath.  She was given 2000 units of unfractionated heparin.  I then used a pigtail catheter to perform an aortogram to evaluate her lower extremities given the difficulties we had with the femoral approach.  I then performed left ventricular angiography with a pigtail catheter.  Left main angiography was performed with a JL-3.5 catheter.  JR-4 was used to perform angiography of the right coronary artery as well as SVG to OM.  LIMA angiography was performed with the same catheter.  The SVG to RCA could not be engaged in spite of trying different catheters  including a no torque catheter, AL-1 and a multipurpose catheter.  I had great difficulty at that time in manipulating any catheter or torquing it due to what seems to be severe spasm in the brachial artery that did not resolve with verapamil.  At this time due to prolonged procedure time and difficulty manipulating any catheters at this point, I decided to finish the diagnostic part after it was obvious that an angioplasty is needed to the vein graft to OM-2.  The patient tolerated the procedure reasonably well with no immediate complications.  Right femoral sheath as well as left brachial sheath were removed manually with manual pressure.  STUDY FINDINGS:  Hemodynamic findings:  Central aortic  pressure was 98/68 with a mean pressure of 78 mmHg. Left ventricular pressure was 98/7 with a left ventricular end-diastolic pressure of 17 mmHg. Left ventricular angiography:  This showed an ejection fraction of 45- 50% with mild global hypokinesis. Abdominal angiography:  This showed relatively normal distal aorta.  The right renal artery has 60-70% ostial stenosis.  Left renal artery has a 30% ostial and proximal stenosis.  The aortobifemoral as noted in both limbs appeared to be patent.  CORONARY ANGIOGRAPHY:  Left main coronary artery:  The vessel was subtotally occluded and could not be selectively engaged. Left anterior descending artery:  The vessel was occluded proximally. Left circumflex:  The vessel was occluded proximally. SVG to OM-2:  This has multiple stents in the proximal mid and distal segments.  A 30% stenosis and in-stent restenosis was noted in the proximal stent.  In the distal stent, there is a focal 90% stenosis. LIMA to LAD:  This was a patent vessel and tortuous.  There was mild diffuse atherosclerosis in the distal LAD. SVG to RCA:  The graft could not be selectively engaged.  CONCLUSION: 1. Severe native three-vessel coronary artery disease.  All native vessels are occluded. 2. Significant in-stent restenosis in SVG to OM-2 is likely the     culprit for symptoms and abnormal stress test. 3. Patent LIMA to LAD. Could not engage the SVG to RCA due to spasm in the brachial artery and inability to torque or advance the catheter in spite of using multiple catheters. 1. Mildly reduced LV systolic function. 2. Severe peripheral vascular disease with limited arterial access.  RECOMMENDATIONS: 1. Angioplasty off SVG to OM-2 is recommended, and we will plan     on doing that on Friday.  Preferably through the femoral approach     if I can go through the graft.  However, just with angiography it     appears that anastomosis is a little bit high and will likely      require an ultrasound guidance.  Alternatively, we might attempt     right radial approach.  We will perform angiography of SVG to RCA     before starting the interventional procedure. 2. We will continue with aspirin and Plavix as well as hydration.     Smoking cessation is strongly recommended.     Lorine Bears, MD     MA/MEDQ  D:  10/18/2010  T:  10/19/2010  Job:  [id number]  Electronically Signed by Lorine Bears MD on 11/08/2010 10:23:19 AM

## 2010-11-10 NOTE — Procedures (Signed)
NAMECHRISHA, Ann Hawkins                  ACCOUNT NO.:  [id number]  MEDICAL RECORD NO.:  [id number]           PATIENT TYPE:  LOCATION:                                 FACILITY:  PHYSICIAN:  Lorine Bears, MD     DATE OF BIRTH:  September 17, 1976  DATE OF PROCEDURE: DATE OF DISCHARGE:                           CARDIAC CATHETERIZATION   PROCEDURES PERFORMED: 1. Saphenous vein graft angiography.. 2. Planned angioplasty to saphenous vein graft to obtuse marginal was     not performed.  SURGEON:  Lorine Bears, MD  INDICATION:  Non-ST-elevation myocardial infarction with known history of coronary artery disease.  CLINICAL HISTORY:  Ann Hawkins is a 35 year old female with known history of coronary artery disease.  Please see my previous dictation on her cardiac catheterization from 2 days ago.  At that time, the procedure could not be completed through the left brachial artery due to inability to engage into the SVG to RCA.  She was found at that time to have a significant in-stent restenosis in the SVG to OM.  The plan was to bring her and have an SVT angioplasty as well as angiography of the SVG to RCA that was not performed during the last procedure.  STUDY DETAILS:  The right radial area was prepped in a sterile fashion. She was given fentanyl and Versed for sedation.  The area was anesthetized with 1% lidocaine.  Radial artery was accessed with difficulty and finally I was able to place a 6-French sheath.  She was given 200 mcg of nitroglycerin through the sheath as well as 3 mg of verapamil.  She was started on IV bivalirudin infusion.  A 3-DRC catheter was used to engage the SVG to RCA.  Angiography showed that the graft was occluded proximally.  In reviewing the old films from 2 days ago showed that actually the distal RCA filled from collaterals from the OM distribution.  STUDY CONCLUSION:  Severe native three-vessel coronary artery disease with old native arteries occluded.  An  occluded SVG to RCA as well as significant disease in SVG to OM.  The only functioning graft is a LIMA to LAD.  RECOMMENDATIONS:  I think her best option is a redo coronary artery bypass graft surgery.  The reason for that is that her SVG to RCA cannot be intervened on percutaneously.  This will leave a large area of the myocardium not revascularized.  She does have some faint collaterals going to that area from the OM distribution.  The alternatives would be to perform high-risk angioplasty on the vein graft to OM, but that will not have very good long-term results given the multiple stents that are placed in that area with high failure rate.  Also the procedure is going to be a very high-risk with no backup in terms of intraaortic balloon pump due to inability to access her femoral arteries.  We will go ahead and stop Plavix.  The case was discussed with Dr. Cornelius Moras.     Lorine Bears, MD     MA/MEDQ  D:  10/20/2010  T:  10/21/2010  Job:  [id number]  Electronically Signed by Lorine Bears MD on 11/08/2010 10:24:21 AM

## 2010-11-14 NOTE — Progress Notes (Signed)
  Phone Note Outgoing Call   Call placed by: Dessie Coma,  March 17, 2010 2:14 PM Call placed to: Patient Summary of Call: Patient notified per Dr. Freida Busman, Echo scheduled for Tues. 03/21/10 at [facility] at 8am.  Instructions given to arrive at 7:30am.

## 2010-11-14 NOTE — Progress Notes (Signed)
----   Converted from flag ---- ---- 03/17/2010 9:48 AM, Marilynne Halsted, CMA, AAMA wrote: Pt has Medicaid only, no precert required for 2D echo  ---- 03/17/2010 9:46 AM, Dessie Coma wrote: Echo at [facility] for Tues. 03/21/10 with Dx: 786.50./on [employer] thanks, Victorino Dike ------------------------------

## 2010-11-14 NOTE — Progress Notes (Signed)
Summary: appts for Lexiscan/Echo rescheduled  Phone Note Outgoing Call   Call placed by: Dessie Coma  LPN,  August 04, 2010 2:53 PM Call placed to: Patient Summary of Call: LMVM-for patient to call this nurse back.  Need to find out if patient went for [facility] at [facility] office Tuesday 08/01/10 as scheduled.  Follow-up for Phone Call        was no show for nuclear test at [facility]. on the 18th.  LMVM-for patient to call this nurse back.  Has f/u tomorrow.  Follow-up by: Dessie Coma  LPN,  August 07, 2010 4:15 PM  Additional Follow-up for Phone Call Additional follow up Details #1::        T.C. from patient-was no show for Lexiscan and Echo on the 18th due to not having money for gas.  States she needs appt first of the month when she gets her check.  Will reschedule appts for the third time.  appts for Lexiscan and Echo rescheduled for 08/23/10 at 8:30am at [facility] office.  Spoke with boyfriend and gave him the times.  Patient to call back was in the bed.  Additional Follow-up by: Dessie Coma  LPN,  August 10, 2010 10:46 AM

## 2010-11-14 NOTE — Progress Notes (Signed)
  Phone Note Call from Patient   Caller: Patient Summary of Call: T.C. from patient's fiancee-states patient has been bleeding from rectum for 3 days.  States he took her to [facility] last night and they were going to give her a GI [place]ail for ulcers but he felt she was bleeding internally from her hernia.  States he doesn't feel she should go for stress test today.  Did not know her Hgb/Hct levels.  Put patient on the phone.  Patient states she will go for test and let them decide if she can tolerate the procedure.  Knows she needs stress test and clearance before she can have hernia repair.  Informed patient it is up to her as to whether she felt well enouth to go for stress test today  but if not needs to call [facility] office to cxl appt.  Initial call taken by: Dessie Coma  LPN,  July 20, 2010 10:46 AM

## 2010-11-14 NOTE — Progress Notes (Signed)
Summary: NUC PRE-PROCEDURE  Phone Note Outgoing Call   Call placed by: Domenic Polite, CNMT,  July 31, 2010 9:31 AM Call placed to: Patient Summary of Call: Left message with information on Myoview Information Sheet (see scanned document for details).  Initial call taken by: Domenic Polite, CNMT,  July 31, 2010 9:31 AM     Nuclear Med Background Indications for Stress Test: Evaluation for Ischemia, Graft Patency   History: Angioplasty, CABG, Heart Catheterization, Stents  History Comments: 2006 CABG X 5, 12/10 cath/angioplasty with stents SVG to OM and SVG to PDA  Symptoms: Chest Pain with Exertion, DOE  Symptoms Comments: CP with radiation to R arm   Nuclear Pre-Procedure Cardiac Risk Factors: Claudication, Family History - CAD, Hypertension, IDDM Type 2, PVD, Smoker

## 2010-11-14 NOTE — Progress Notes (Signed)
Summary: nuc pre procedure  Phone Note Outgoing Call Call back at Home Phone [phone]   Call placed by: Cathlyn Parsons RN,  August 22, 2010 4:51 PM Call placed to: Patient Reason for Call: Confirm/change Appt Summary of Call: Left message with information on Myoview Information Sheet (see scanned document for details).      Nuclear Med Background Indications for Stress Test: Evaluation for Ischemia, Graft Patency   History: Angioplasty, CABG, Heart Catheterization, Stents  History Comments: 2006 CABG X 5, 12/10 cath/angioplasty with stents SVG to OM and SVG to PDA  Symptoms: Chest Pain with Exertion, DOE  Symptoms Comments: CP with radiation to R arm   Nuclear Pre-Procedure Cardiac Risk Factors: Claudication, Family History - CAD, Hypertension, IDDM Type 2, PVD, Smoker

## 2010-11-14 NOTE — Progress Notes (Signed)
Summary: nuc. pre-procedure  Phone Note Outgoing Call Call back at [facility] Phone [phone]   Call placed by: Domenic Polite, CNMT,  July 19, 2010 3:45 PM Call placed to: Patient Reason for Call: Confirm/change Appt Summary of Call: Reviewed information on Myoview Information Sheet (see scanned document for further details).  Spoke with patient  Initial call taken by: Domenic Polite, CNMT,  July 19, 2010 3:46 PM     Nuclear Med Background Indications for Stress Test: Evaluation for Ischemia, Graft Patency   History: Angioplasty, CABG, Heart Catheterization, Stents  History Comments: 2006 CABG X 5, 12/10 cath/angioplasty with stents SVG to OM and SVG to PDA  Symptoms: Chest Pain with Exertion, DOE  Symptoms Comments: CP with radiation to R arm   Nuclear Pre-Procedure Cardiac Risk Factors: Claudication, Family History - CAD, Hypertension, IDDM Type 2, PVD, Smoker

## 2010-11-14 NOTE — Progress Notes (Signed)
Summary: No show for Myoview and Echo test  Phone Note Outgoing Call Call back at Home Phone [phone]   Call placed by: Irean Hong, RN,  August 23, 2010 2:54 PM Summary of Call: The patient was a no show for the myoview and echo today. I Left a message on the patient's recorder to call us back about appointments. I sent a flag to  Dr.Arida about the no show. Patsy Edwards,RN.

## 2010-11-14 NOTE — Progress Notes (Signed)
Summary: reminded of nuc./echo 08/23/10   Phone Note Outgoing Call   Call placed by: Dessie Coma  LPN,  August 22, 2010 3:01 PM Call placed to: Patient Summary of Call: Spoke to patient-reminded of appts tomorrow for [facility] and Echo.  To arrive at office on [facility] by 8:15am for 8:30am appt.  Reminded of instructions.  Patient voiced understanding.

## 2010-11-14 NOTE — Progress Notes (Signed)
Summary: cancelled stress test and echo  Phone Note Call from Patient   Caller: Spouse Summary of Call: Pt's spouse called to cancel nuclear stress test and echo due to patient having a GI bleed. Her spouse was going to take her to the hospital and take care of the situation. Initial call taken by: Domenic Polite, CNMT,  July 20, 2010 11:14 AM

## 2010-11-14 NOTE — Progress Notes (Signed)
  Phone Note Outgoing Call   Call placed by: Dessie Coma  LPN,  July 19, 2010 9:16 AM Call placed to: Patient Summary of Call: Patient notified per Dr. Kirke Corin, needs to have fasting lipid/liver panel.  Sara Lee. notified to add labs at time of tests tomorrow with Dx: 272.0/v58.69.

## 2010-11-16 NOTE — Progress Notes (Signed)
Summary: refill  Phone Note Call from Patient Call back at Home Phone [phone]   Caller: Patient Reason for Call: Refill Medication, Talk to Nurse Summary of Call: NITROLINGUAL 0.4 MG/SPRAY SOLN needs authorization. pharmacy # [phone] Initial call taken by: Roe Coombs,  September 29, 2010 1:19 PM  Follow-up for Phone Call        Spoke to pharmacist at [facility]-Dr. Gailen Shelter is the one who wrote Rx for Nitro. for patient and is the Dr. they faxed the prior auth. to.  Patient called and I explained this to her.  She is to call her PCP for auth. Follow-up by: Dessie Coma  LPN,  September 29, 2010 2:45 PM

## 2010-11-16 NOTE — Progress Notes (Signed)
Summary: Nuclear Pre-Procedure  Phone Note Outgoing Call Call back at [facility] Phone [phone]   Call placed by: Stanton Kidney, EMT-P,  October 13, 2010 10:19 AM Action Taken: Phone Call Completed Summary of Call: Left message with information on Myoview Information Sheet (see scanned document for details). Stanton Kidney, EMT-P  October 13, 2010 10:19 AM     Nuclear Med Background Indications for Stress Test: Evaluation for Ischemia, Graft Patency   History: Angioplasty, CABG, Heart Catheterization, Stents  History Comments: 2006 CABG X 5, 12/10 cath/angioplasty with stents SVG to OM and SVG to PDA  Symptoms: Chest Tightness, Chest Tightness with Exertion, DOE  Symptoms Comments: CP with radiation to R arm   Nuclear Pre-Procedure Cardiac Risk Factors: Claudication, Family History - CAD, Hypertension, IDDM Type 2, PVD, Smoker

## 2010-11-16 NOTE — Assessment & Plan Note (Signed)
Summary: ROV/ABNORMAL STRESS TEST/DM   History of Present Illness: 35 year old female with past medical history of coronary artery disease status post coronary artery bypass and graft has an add on for chest pain. Coronary artery disease, status post coronary artery bypass graft surgery in 2006, with multiple subsequent angioplasty and stent placement.  Most recent cardiac catheterization was done in December 2010 at [facility].  At that time, catheterization showed total occlusion of LAD, left circumflex and right coronary artery.  The LIMA to LAD was patent.  There was significant disease and SVG to OM1 and SVG to right PDA.  She underwent angioplasty and a drug-eluting stent placement to both grafts. Seen by Dr. Kirke Corin recently for chest pain. Myoview performed today revealed ischemia in the inferolateral wall; EF 50. She also c/o chest pain and I was asked to evaluate. Echocardiogram today revealed inferobasal hypokinesis with an ejection fraction of 50-55%. Patient states that she has had almost continuous chest pain in the past 5 months. It is under the left breast. It occasionally radiates to her back and left upper extremity. There is associated shortness of breath, nausea and diaphoresis. The pain increases with activities and with stress. She takes nitroglycerin for the pain. Again it has been essentially continuous for the past 5 months.   Preventive Screening-Counseling & Management  Alcohol-Tobacco     Smoking Status: current      Drug Use:  no.    Current Medications (verified): 1)  Plavix 75 Mg Tabs (Clopidogrel Bisulfate) .... Take One Tablet By Mouth Once Daily 2)  Crestor 40 Mg Tabs (Rosuvastatin Calcium) .... Take One Tablet By Mouth At Bedtime 3)  Imdur 60 Mg Xr24h-Tab (Isosorbide Mononitrate) .... Take One Tablet By Mouth Once Daily 4)  Lasix 40 Mg Tabs (Furosemide) .... Take One Tablet By Mouth Two Times A Day 5)  Nitrolingual 0.4 Mg/spray Soln  (Nitroglycerin) .... Take As Directed 6)  Verapamil Hcl 80 Mg Tabs (Verapamil Hcl) .... Take One Tablet By Mouth Two Times A Day 7)  Norco 5-325 Mg Tabs (Hydrocodone-Acetaminophen) .... 4 Times Daily 8)  Neurontin 600 Mg Tabs (Gabapentin) .... 2 Tab By Mouth Three Times A Day 9)  Lopressor 50 Mg Tabs (Metoprolol Tartrate) .Marland Kitchen.. 1  Tab By Mouth Two Times A Day  Allergies: 1)  ! Penicillin  Past History:  Past Medical History: DM Hypertension Hyperlipidemia Goiter Asthma PUD CAD PVD  Past Surgical History: CABG 2006 Bilateral fem pop 2006 Partial hysterectomy Abdominal hernias  Family History: Mother with CAD Father with CAD  Social History: Disabled  Single  Tobacco Use - Yes.  Alcohol Use - no Drug Use - no Smoking Status:  current Drug Use:  no  Review of Systems       no fevers or chills, productive cough, hemoptysis, dysphasia, odynophagia, melena, hematochezia, dysuria, hematuria, rash, seizure activity, orthopnea, PND, pedal edema, claudication. Remaining systems are negative.   Vital Signs:  Patient profile:   35 year old female Height:      64 inches Weight:      175 pounds BMI:     30.15 Pulse rate:   82 / minute Resp:     14 per minute BP sitting:   110 / 70  (left arm)  Vitals Entered By: Kem Parkinson (October 17, 2010 3:59 PM) \  Physical Exam  General:  Well developed/well nourished in NAD Skin warm/dry; tattoos Patient not depressed No peripheral clubbing Back-normal HEENT-normal/normal eyelids Neck supple/normal carotid  upstroke bilaterally; right bruit; no JVD; no thyromegaly chest - CTA/ normal expansion; status post sternotomy  CV - RRR/normal S1 and S2; no murmurs, rubs or gallops;  PMI nondisplaced Abdomen -NT/ND, no HSM, no mass, + bowel sounds, no bruit; large abdominal hernia in the right lower quadrant diminished femoral pulses, no bruits Ext-no edema, chords, 2+ DP Neuro-grossly nonfocal     EKG  Procedure date:   10/17/2010  Findings:      Sinus, inferolateral ST depression.  Impression & Recommendations:  Problem # 1:  CHEST PAIN (ICD-786.50)  Difficult situation. Her chest pain has been continuous. However she has known disease and an abnormal Myoview showing inferolateral ischemia. Plan admit and cycle enzymes. Treat with aspirin, Plavix, heparin and statin. Proceed with cardiac catheterization tomorrow morning. The risks and benefits have been discussed including but not limited to stroke, myocardial infarction and death. The patient agrees to proceed.  Her updated medication list for this problem includes:    Plavix 75 Mg Tabs (Clopidogrel bisulfate) .Marland Kitchen... Take one tablet by mouth once daily    Imdur 60 Mg Xr24h-tab (Isosorbide mononitrate) .Marland Kitchen... Take one tablet by mouth once daily    Nitrolingual 0.4 Mg/spray Soln (Nitroglycerin) .Marland Kitchen... Take as directed    Verapamil Hcl 80 Mg Tabs (Verapamil hcl) .Marland Kitchen... Take one tablet by mouth two times a day    Lopressor 50 Mg Tabs (Metoprolol tartrate) .Marland Kitchen... 1  tab by mouth two times a day  Problem # 2:  HYPERLIPIDEMIA (ICD-272.4) Continue statin. Check lipids and liver. Her updated medication list for this problem includes:    Crestor 40 Mg Tabs (Rosuvastatin calcium) .Marland Kitchen... Take one tablet by mouth at bedtime  Problem # 3:  PVD (ICD-443.9) Continue aspirin and statin. Her updated medication list for this problem includes:    Plavix 75 Mg Tabs (Clopidogrel bisulfate) .Marland Kitchen... Take one tablet by mouth once daily    Imdur 60 Mg Xr24h-tab (Isosorbide mononitrate) .Marland Kitchen... Take one tablet by mouth once daily    Nitrolingual 0.4 Mg/spray Soln (Nitroglycerin) .Marland Kitchen... Take as directed    Verapamil Hcl 80 Mg Tabs (Verapamil hcl) .Marland Kitchen... Take one tablet by mouth two times a day    Lopressor 50 Mg Tabs (Metoprolol tartrate) .Marland Kitchen... 1  tab by mouth two times a day  Problem # 4:  DM (ICD-250.00) Follow CBGs.  Problem # 5:  ESSENTIAL HYPERTENSION, BENIGN  (ICD-401.1) Continue present blood pressure medications but hold Lasix prior to procedure. Her updated medication list for this problem includes:    Lasix 40 Mg Tabs (Furosemide) .Marland Kitchen... Take one tablet by mouth two times a day    Verapamil Hcl 80 Mg Tabs (Verapamil hcl) .Marland Kitchen... Take one tablet by mouth two times a day    Lopressor 50 Mg Tabs (Metoprolol tartrate) .Marland Kitchen... 1  tab by mouth two times a day  Problem # 6:  CAD (ICD-414.00) Continue aspirin, Plavix, beta blocker and statin. Her updated medication list for this problem includes:    Plavix 75 Mg Tabs (Clopidogrel bisulfate) .Marland Kitchen... Take one tablet by mouth once daily    Imdur 60 Mg Xr24h-tab (Isosorbide mononitrate) .Marland Kitchen... Take one tablet by mouth once daily    Nitrolingual 0.4 Mg/spray Soln (Nitroglycerin) .Marland Kitchen... Take as directed    Verapamil Hcl 80 Mg Tabs (Verapamil hcl) .Marland Kitchen... Take one tablet by mouth two times a day    Lopressor 50 Mg Tabs (Metoprolol tartrate) .Marland Kitchen... 1  tab by mouth two times a day  Problem # 7:  TOBACCO ABUSE (ICD-305.1) Patient counseled on discontinuing.  Problem # 8:  CAROTID BRUIT (ICD-785.9) Await followup carotid Dopplers performed today.

## 2010-11-16 NOTE — Miscellaneous (Signed)
Summary: Orders Update  Clinical Lists Changes  Problems: Added new problem of CAROTID BRUIT (ICD-785.9) Orders: Added new Test order of Carotid Duplex (Carotid Duplex) - Signed 

## 2010-11-16 NOTE — Assessment & Plan Note (Signed)
Summary: Cardiology Nuclear Testing  Nuclear Med Background Indications for Stress Test: Evaluation for Ischemia, Graft Patency, Stent Patency   History: Angioplasty, CABG, Heart Catheterization, Stents  History Comments: 2006 CABG X 5, 12/10 cath/angioplasty with stents SVG to OM and SVG to PDA  Symptoms: Chest Tightness, Chest Tightness with Exertion, DOE, Palpitations, SOB  Symptoms Comments: CP with radiation to R arm   Nuclear Pre-Procedure Cardiac Risk Factors: Claudication, Family History - CAD, Hypertension, IDDM Type 2, PVD, Smoker Caffeine/Decaff Intake: None NPO After: 9:30 PM Lungs: clear IV 0.9% NS with Angio Cath: 20g     IV Site: (R) Posterior upper arm IV Started by: Stanton Kidney, EMT-P Chest Size (in) 40     Cup Size D     Height (in): 64 Weight (lb): 183 BMI: 31.53 Tech Comments: Lopressor held this am, per Patient.  Nuclear Med Study 1 or 2 day study:  1 day     Stress Test Type:  Eugenie Birks Reading MD:  Charlton Haws, MD     Referring MD:  Judie Petit.Arida Resting Radionuclide:  Technetium 28m Tetrofosmin     Resting Radionuclide Dose:  10.8 mCi  Stress Radionuclide:  Technetium 23m Tetrofosmin     Stress Radionuclide Dose:  33 mCi   Stress Protocol  Max Systolic BP: 136 mm Hg Lexiscan: 0.4 mg   Stress Test Technologist:  Milana Na, EMT-P     Nuclear Technologist:  Domenic Polite, CNMT  Rest Procedure  Myocardial perfusion imaging was performed at rest 45 minutes following the intravenous administration of Technetium 74m Tetrofosmin.  Stress Procedure  The patient received IV Lexiscan 0.4 mg over 15-seconds.  Technetium 66m Tetrofosmin injected at 30-seconds.  There were no significant changes with infusion.  Quantitative spect images were obtained after a 45 minute delay.  QPS Raw Data Images:  Normal; no motion artifact; normal heart/lung ratio. Stress Images:  Decreased inferolateral counts Rest Images:  Normal homogeneous uptake in all areas of the  myocardium. Subtraction (SDS):  SDS 5 abnormal in inferior wall Transient Ischemic Dilatation:  1.06  (Normal <1.22)  Lung/Heart Ratio:  .37  (Normal <0.45)  Quantitative Gated Spect Images QGS EDV:  102 ml QGS ESV:  51 ml QGS EF:  50 % QGS cine images:  Inferobasal hypokinesis  Findings High risk nuclear study Evidence for inferior ischemia   Evidence for LV Dysfunction LV Dysfunction    Overall Impression  Exercise Capacity: Lexiscan with no exercise. BP Response: Normal blood pressure response. Clinical Symptoms: No chest pain ECG Impression: ST depression in lateral leads with bolus Overall Impression: Marked inferior to inferolateral ischemia from apex to base.  Patient admitted to hospital

## 2010-11-16 NOTE — Progress Notes (Signed)
----   Converted from flag ---- ---- 09/29/2010 2:22 PM, Marilynne Halsted, CMA, AAMA wrote: ok No precert for lexiscan, I forwarded rest of info to Larrie  ---- 09/29/2010 1:57 PM, Ann Coma  LPN wrote: Eugenie Birks, Echo, Carotid Duplex all scheduled for Jan. 3. Dx: 786.50,785.9,786.09.  Thanks,Vylette Strubel ------------------------------

## 2010-11-16 NOTE — Progress Notes (Signed)
Summary: Nitrostat Rx  Phone Note Call from Patient   Caller: Patient Summary of Call: T. C. from patient-PCP has not taken care of prior authorization for Nitroglycerin spray and she's been out of this for 4-5days.  Requests sublingual tablets now.   Rx called in to [facility] for Nitrostat 0.4mg  sl tablets take as directed with 1RF. Initial call taken by: Dessie Coma  LPN,  October 02, 2010 4:02 PM

## 2010-11-16 NOTE — Consult Note (Signed)
[facility]   Imported By: Marylou Mccoy 11/03/2010 18:12:22  _____________________________________________________________________  External Attachment:    Type:   Image     Comment:   External Document

## 2010-11-17 ENCOUNTER — Other Ambulatory Visit: Payer: Self-pay | Admitting: Thoracic Surgery (Cardiothoracic Vascular Surgery)

## 2010-11-17 DIAGNOSIS — I2510 Atherosclerotic heart disease of native coronary artery without angina pectoris: Secondary | ICD-10-CM

## 2010-11-20 ENCOUNTER — Encounter: Payer: Self-pay | Admitting: Cardiology

## 2010-11-20 ENCOUNTER — Encounter: Payer: Self-pay | Admitting: Cardiovascular Disease

## 2010-11-20 ENCOUNTER — Ambulatory Visit
Admission: RE | Admit: 2010-11-20 | Discharge: 2010-11-20 | Disposition: A | Discharge status: Home / Self Care | Payer: Medicaid Other | Source: Ambulatory Visit | Attending: Thoracic Surgery (Cardiothoracic Vascular Surgery) | Admitting: Thoracic Surgery (Cardiothoracic Vascular Surgery)

## 2010-11-20 ENCOUNTER — Encounter (INDEPENDENT_AMBULATORY_CARE_PROVIDER_SITE_OTHER): Payer: Medicaid Other | Admitting: Thoracic Surgery (Cardiothoracic Vascular Surgery)

## 2010-11-20 DIAGNOSIS — I2510 Atherosclerotic heart disease of native coronary artery without angina pectoris: Secondary | ICD-10-CM

## 2010-11-21 ENCOUNTER — Encounter: Payer: Self-pay | Admitting: Cardiovascular Disease

## 2010-11-23 ENCOUNTER — Encounter (INDEPENDENT_AMBULATORY_CARE_PROVIDER_SITE_OTHER): Payer: Medicaid Other | Admitting: Cardiovascular Disease

## 2010-11-23 DIAGNOSIS — I2510 Atherosclerotic heart disease of native coronary artery without angina pectoris: Secondary | ICD-10-CM

## 2010-11-23 DIAGNOSIS — E7800 Pure hypercholesterolemia, unspecified: Secondary | ICD-10-CM

## 2010-11-23 DIAGNOSIS — I10 Essential (primary) hypertension: Secondary | ICD-10-CM

## 2010-11-24 NOTE — Assessment & Plan Note (Signed)
OFFICE VISIT  BRIARROSE, SHOR DOB:  [birth year]                                        November 20, 2010 CHART #:  [id number]  HISTORY OF PRESENT ILLNESS:  The patient returns for followup, status post redo coronary artery bypass grafting x2 on October 17, 2010.  Her postoperative recovery has been uncomplicated.  Since hospital discharge, she has gradually improved.  She did develop skin separation of 2/3 chest tube incisions which has required home health nursing to help them with local wound care for these minor issues.  From a surgical standpoint, she has otherwise recovered without any problems.  She has a low threshold for pain, and she remains on oral narcotic pain relievers. She has not had any trouble with shortness of breath.  She is making an effort to work on her diabetes management, and it sounds that her blood sugars are up and down a bit, but certainly doing better than she was prior to her hospital admission.  She has been scheduled to be evaluated by Dr. Everlene Other who will take over her long-term care for primary medical therapy.  She states that she is eating fairly well.  She is sleeping well at night.  She is not having any shortness of breath.  She has not had any productive cough.  The remainder of her review of systems are unremarkable.  PHYSICAL EXAM:  Physical exam is notable for a well-appearing obese female with blood pressure 145/85, pulse 72, oxygen saturation 98% on room air.  Examination of the chest reveals a median sternotomy scar that is healing quite nicely.  The sternum is stable on palpation.  The open chest tube incisions are clean and granulating in nicely.  The one underneath the right breast has almost completely filled in.  The other one is a little bit deeper, but probes to approximately 8 or 10 mm in depth.  These are clean and without any associated purulence whatsoever. Cardiovascular exam is notable for regular rate and  rhythm. Auscultation reveals clear breath sounds that are symmetrical bilaterally.  No wheezes, rales or rhonchi are noted.  The abdomen is soft and nontender.  The extremities are warm and well-perfused.  There is no lower extremity edema.  The small incision from vein harvest is healing nicely with exception of a small dry eschar.  There is no sign of infection.  The remainder of her physical exam is unremarkable.  DIAGNOSTIC TEST:  Chest x-ray performed today at the [facility] is reviewed.  This demonstrates clear lung fields bilaterally. All the sternal wires appear intact.  There are no pleural effusions. No other abnormalities are noted.  IMPRESSION:  Satisfactory progress following recent redo coronary artery bypass grafting.  PLAN:  I have encouraged the patient to continue to increase her physical activity as tolerated with her only limitation at this point remaining that she refrain from heavy lifting or strenuous use of her arms or shoulders for at least another 2 months.  I have encouraged her to get involved in the cardiac rehab program which she plans to do.  We have discussed how important it will remain for her to continue to avoid any tobacco use and to really work hard on her long-term diabetes management.  We spent sometime discussing this and all of her questions have been addressed.  We will  plan to see the patient in 2 months just to make sure that her chest tube incisions have healed completely and she otherwise seems to be getting along fine.  At this point, she seems to be recovering uneventfully.  Salvatore Decent. Cornelius Moras, M.D. Electronically Signed  CHO/MEDQ  D:  11/20/2010  T:  11/20/2010  Job:  [id number]  cc:   Veverly Fells. Excell Seltzer, MD Lorine Bears, MD Madolyn Frieze. Jens Som, MD, [facility]

## 2010-11-27 NOTE — Op Note (Signed)
  NAMEMIAMOR, AYLER                  ACCOUNT NO.:  [id number]  MEDICAL RECORD NO.:  [id number]           PATIENT TYPE:  LOCATION:                                 FACILITY:  PHYSICIAN:  Arita Severtson E. Mahogani Holohan, M.D.DATE OF BIRTH:  Nov 22, 1975  DATE OF PROCEDURE: DATE OF DISCHARGE:                              OPERATIVE REPORT   PROCEDURE:  Intraoperative transesophageal echocardiography.  I was consulted by Dr. Nadara Mode to provide intraoperative transesophageal echocardiography support for his patient, Ms. Lessig.  Ms. Kohen was brought to the holding area of [facility] where intravenous access was obtained, pulmonary artery catheter was placed, and an intraarterial monitoring was established.  The patient was brought to the operating room where she underwent a stable induction of general anesthesia.  After securing her airway, a transesophageal echocardiography probe was passed easily to a depth of 40 cm from the teeth.  The following views were obtained.  The cross-section of the left ventricle was normal appearance.  There were no noted regional wall motion abnormalities at the time of the exam.  The ejection fraction was approximately 45% to 50%.  The right ventricle was normal in appearance.  The probe was withdrawn, and the four-chamber views of the valves were obtained.  The interatrial septum was clear without evidence of defects. The right side of the heart was unremarkable.  Placement of the pulmonary artery catheter was noted.  The aortic valve was examined.  It was trileaflet and moving normally. There was no noted insufficiency or stenosis of the aortic valve.  The mitral valve apparatus was examined.  There was no evidence of insufficiency or stenosis as the valve was examined.  The left atrial appendage was clear.  The probe was then placed in a left ventricle cross-section to use for monitoring intraoperatively and assistance of placing cannulas throughout the  procedure.          ______________________________ Janetta Hora. Gelene Mink, M.D.     CEF/MEDQ  D:  10/24/2010  T:  10/25/2010  Job:  [id number]  Electronically Signed by Hart Robinsons M.D. on 11/27/2010 01:50:24 PM

## 2010-12-06 NOTE — Progress Notes (Signed)
Summary: [facility]: Office Visit  [facility]: Office Visit   Imported By: Earl Many 11/28/2010 15:34:58  _____________________________________________________________________  External Attachment:    Type:   Image     Comment:   External Document

## 2010-12-25 ENCOUNTER — Encounter: Payer: Self-pay | Admitting: Cardiovascular Disease

## 2010-12-25 ENCOUNTER — Encounter (HOSPITAL_COMMUNITY): Payer: Medicaid Other

## 2010-12-25 ENCOUNTER — Ambulatory Visit: Payer: Self-pay | Admitting: Thoracic Surgery (Cardiothoracic Vascular Surgery)

## 2010-12-25 LAB — COMPREHENSIVE METABOLIC PANEL
ALT: 14 U/L (ref 0–35)
AST: 23 U/L (ref 0–37)
Albumin: 3.5 g/dL (ref 3.5–5.2)
Alkaline Phosphatase: 82 U/L (ref 39–117)
BUN: 4 mg/dL — ABNORMAL LOW (ref 6–23)
CO2: 26 mEq/L (ref 19–32)
Calcium: 8.9 mg/dL (ref 8.4–10.5)
Chloride: 98 mEq/L (ref 96–112)
Creatinine, Ser: 0.56 mg/dL (ref 0.4–1.2)
GFR calc Af Amer: >60 mL/min (ref >60)
GFR calc non Af Amer: >60 mL/min (ref >60)
Glucose, Bld: 124 mg/dL — ABNORMAL HIGH (ref 70–99)
Potassium: 3.4 mEq/L — ABNORMAL LOW (ref 3.5–5.1)
Sodium: 132 mEq/L — ABNORMAL LOW (ref 135–145)
Total Bilirubin: 0.3 mg/dL (ref 0.3–1.2)
Total Protein: 6.6 g/dL (ref 6.0–8.3)

## 2010-12-25 LAB — LIPID PANEL
Cholesterol: 133 mg/dL (ref 0–200)
HDL: 39 mg/dL — ABNORMAL LOW (ref >39)
LDL Cholesterol: 18 mg/dL (ref 0–99)
Total CHOL/HDL Ratio: 3.4 RATIO
Triglycerides: 378 mg/dL — ABNORMAL HIGH (ref <150)
VLDL: 76 mg/dL — ABNORMAL HIGH (ref 0–40)

## 2010-12-25 LAB — CK TOTAL AND CKMB (NOT AT ARMC)
CK, MB: 10.5 ng/mL (ref 0.3–4.0)
Relative Index: 6.9 — ABNORMAL HIGH (ref 0.0–2.5)
Total CK: 152 U/L (ref 7–177)

## 2010-12-25 LAB — CBC
HCT: 37.9 % (ref 36.0–46.0)
Hemoglobin: 12.4 g/dL (ref 12.0–15.0)
MCH: 28.7 pg (ref 26.0–34.0)
MCHC: 32.7 g/dL (ref 30.0–36.0)
MCV: 87.7 fL (ref 78.0–100.0)
Platelets: 197 10*3/uL (ref 150–400)
RBC: 4.32 MIL/uL (ref 3.87–5.11)
RDW: 13.5 % (ref 11.5–15.5)
WBC: 7.9 10*3/uL (ref 4.0–10.5)

## 2010-12-25 LAB — TROPONIN I: Troponin I: 0.7 ng/mL (ref 0.00–0.06)

## 2010-12-25 LAB — PROTIME-INR
INR: 0.92 (ref 0.00–1.49)
Prothrombin Time: 12.6 seconds (ref 11.6–15.2)

## 2010-12-25 LAB — TSH: TSH: 1.959 u[IU]/mL (ref 0.350–4.500)

## 2010-12-25 LAB — MRSA PCR SCREENING: MRSA by PCR: NEGATIVE

## 2010-12-26 NOTE — Letter (Signed)
Summary: [facility] Office Visit Note   [facility] Office Visit Note   Imported By: Roderic Ovens 12/19/2010 14:50:33  _____________________________________________________________________  External Attachment:    Type:   Image     Comment:   External Document

## 2010-12-27 ENCOUNTER — Encounter (HOSPITAL_COMMUNITY): Payer: Medicaid Other

## 2010-12-29 ENCOUNTER — Encounter (HOSPITAL_COMMUNITY): Payer: Medicaid Other

## 2011-01-01 ENCOUNTER — Ambulatory Visit: Payer: Self-pay

## 2011-01-01 ENCOUNTER — Encounter (HOSPITAL_COMMUNITY): Payer: Medicaid Other

## 2011-01-03 ENCOUNTER — Encounter (HOSPITAL_COMMUNITY): Payer: Medicaid Other

## 2011-01-03 ENCOUNTER — Telehealth: Payer: Self-pay | Admitting: Cardiovascular Disease

## 2011-01-03 NOTE — Telephone Encounter (Signed)
Returned patient's call-patient states she is congested, has a sore throat, and running a fever.  States she called her PCP and they want her to come in or go to urgent care.  Advised to see PCP or urgent care.  Informed her  Dr. Kirke Corin will not call her in antibiotics.

## 2011-01-03 NOTE — Telephone Encounter (Signed)
Pt states she needs antibiotics. Pt would like to get an zpak.

## 2011-01-05 ENCOUNTER — Encounter (HOSPITAL_COMMUNITY): Payer: Medicaid Other

## 2011-01-05 NOTE — Assessment & Plan Note (Signed)
[facility]                       [place] [facility] OFFICE NOTE  NAME:Friar, ZAWADI                           MRN:          [id number] DATE:11/23/2010                            DOB:          1976-01-24   HISTORY OF PRESENT ILLNESS:  Ms. Smucker is a 35 year old female who is here today for a followup visit after her recent hospitalization and redo-CABG.  She has the following extensive medical problems: 1. Coronary artery disease status post coronary artery bypass graft     surgery in 2006 with multiple subsequent angioplasties and stent     placements to her vein grafts.  Most recent cardiac catheterization     in January 2011 showed  occluded native arteries.  LIMA to LAD was     patent.  SVG to RCA was occluded and SVG to OM had a 90% distal in-     stent re-stenosis.  Her cardiac catheterization was extremely     difficult due to difficulty with access.  Left radial appeared to     be occluded. one part of the procedure was performed through the     left brachial artery and the other was through the right radial     artery.  The patient underwent redo coronary artery bypass graft     surgery by Dr. Cornelius Moras with an SVG to OM and SVG to right PDA.     However, the quality of the RCA target was very poor and it was     felt that the SVG to PDA was almost nonfunctional.  She is not a     candidate for redo bypass. 2. Peripheral arterial disease status post aortobifemoral bypass     surgery in 2006. 3. Type 2 diabetes. 4. Hypertension. 5. History of bleeding ulcer in the esophagus 6. Obesity. 7. Tobacco use, quit since her recent surgery. 8. Large abdominal hernia. 9. History of noncompliance with medical therapy. 10.Bilateral carotid stenosis. 40-59% on right and 60-79% on left.  INTERVAL HISTORY:  Ms. Stratton was admitted in January of this year after a high-risk stress test and persistent chest pain.  She ruled in for a small non-ST-elevation  myocardial infarction.  I performed cardiac catheterization which was extremely difficult.  The right femoral approach was not possible due to her previous aortobifemoral surgery.  I did obtain access, but I was actually in the native iliac artery and not the graft.  Left radial approach could not be done due to lack of pulse in that area likely an occluded radial artery in the left side from her multiple previous catheterizations.  I performed the catheterization through the left brachial artery which was very difficult due to difficulty in torquing the catheter.  I could not cannulate the SVG to the right PDA.  Thus, she had another procedure done through the right radial artery which showed that the graft was actually occluded.  After discussing multiple options, the patient underwent redo-bypass surgery by Dr. Cornelius Moras.  Her post surgery was complicated by hypotension which required inotropic support.  It was felt that the patient would not be a  candidate for redo bypass.  The patient had a slow recovery since then. She still has significant discomfort at the surgical site.  She quit smoking since her recent event.  She seems to be more compliant with medications at this time.  MEDICATIONS: 1. Plavix 75 mg once daily. 2. Crestor 40 mg at bedtime. 3. Lasix 40 mg once daily. 4. Insulin Lantus 24 units twice daily. 5. Ambien 10 mg at bedtime as needed. 6. Neurontin 600 mg three times daily. 7. Aspirin 81 mg once daily. 8. Oxycodone 5 mg 1-2 tablets every 6 hours as needed. 9. Potassium chloride 20 mEq once daily. 10.Metoprolol 12.5 mg twice daily.  PHYSICAL EXAMINATION:  GENERAL:  The patient appears to be older than her stated age.  She appears to be in some discomfort due to pain at the surgical site.  She is in no acute distress.  Nonetheless. VITAL SIGNS:  Weight is 182.6 pounds, blood pressure is 128/74, pulse is 77, oxygen saturation is 98% on room air. HEENT:  Normocephalic  and atraumatic. NECK:  She has carotid bruit on the left side. RESPIRATORY:  Normal respiratory effort with no use of accessory muscles.  Clear to auscultation, reveals normal breath sounds. CARDIOVASCULAR:  Normal PMI.  Normal S1 and S2 with no gallops or murmurs. ABDOMEN:  Nontender and nondistended.  There is a large abdominal hernia. EXTREMITIES:  With no clubbing, cyanosis, or edema. SKIN:  Warm and dry with no rash. PSYCHIATRIC:  She is alert, oriented x3 with normal mood and affect. MUSCULOSKELETAL:  There is normal muscle strength in the upper and lower extremities.  IMPRESSION: 1. Coronary artery disease:  She is status post redo coronary artery     bypass graft surgery which was done last month.  I had a prolonged     discussion with the patient again about the limited options in the     future in terms of revascularization.  She is definitely not a     candidate for another bypass surgery.  That would leave the option     of angioplasty only which has been really difficult due to     difficult arterial access.  I congratulated her on stopping     smoking.  She seems to be also more compliant with medications than     before.  We will continue with current medications.  She was taken     off lisinopril which will likely be resumed in the future,     especially that she is diabetic. 2. Moderate Carotid artery stenosis bilaterally.  We will continue     with medical therapy for now. 3. Hyperlipidemia:  We will request a fasting lipid profile.  We will     also request CMP and CBC given that she was significantly anemic     recently. 4. Tobacco use:  The patient quit smoking recently and was     congratulated on her efforts. 5. Pain management:  The patient is still having significant     discomfort at the surgical site.  I provided her with oxycodone 5     mg every 6 hours as needed, quantity is 40.  I also refilled     her Xanax and Ambien with 64-month supply on each.  I  explained to     her that I will not be     able to provide any further refills on these medications.  She will     have to request from  her primary care physician.  She will follow     up in 3 months from now or earlier as needed.    Lorine Bears, MD Electronically Signed   MA/MedQ  DD: 11/23/2010  DT: 11/24/2010  Job #: [id number]

## 2011-01-08 ENCOUNTER — Encounter (HOSPITAL_COMMUNITY): Payer: Medicaid Other

## 2011-01-10 ENCOUNTER — Encounter (HOSPITAL_COMMUNITY): Payer: Medicaid Other

## 2011-01-12 ENCOUNTER — Encounter (HOSPITAL_COMMUNITY): Payer: Medicaid Other

## 2011-01-15 ENCOUNTER — Encounter (HOSPITAL_COMMUNITY): Payer: Medicaid Other

## 2011-01-17 ENCOUNTER — Encounter (HOSPITAL_COMMUNITY): Payer: Medicaid Other

## 2011-01-19 ENCOUNTER — Encounter (HOSPITAL_COMMUNITY): Payer: Medicaid Other

## 2011-01-22 ENCOUNTER — Encounter (HOSPITAL_COMMUNITY): Payer: Medicaid Other

## 2011-01-24 ENCOUNTER — Encounter (HOSPITAL_COMMUNITY): Payer: Medicaid Other

## 2011-01-26 ENCOUNTER — Encounter (HOSPITAL_COMMUNITY): Payer: Medicaid Other

## 2011-01-29 ENCOUNTER — Encounter (HOSPITAL_COMMUNITY): Payer: Medicaid Other

## 2011-01-29 ENCOUNTER — Encounter: Payer: Medicaid Other | Admitting: Thoracic Surgery (Cardiothoracic Vascular Surgery)

## 2011-01-31 ENCOUNTER — Encounter (HOSPITAL_COMMUNITY): Payer: Medicaid Other

## 2011-02-02 ENCOUNTER — Encounter (HOSPITAL_COMMUNITY): Payer: Medicaid Other

## 2011-02-05 ENCOUNTER — Encounter (HOSPITAL_COMMUNITY): Payer: Medicaid Other

## 2011-02-07 ENCOUNTER — Encounter (HOSPITAL_COMMUNITY): Payer: Medicaid Other

## 2011-02-09 ENCOUNTER — Encounter (HOSPITAL_COMMUNITY): Payer: Medicaid Other

## 2011-02-12 ENCOUNTER — Encounter (HOSPITAL_COMMUNITY): Payer: Medicaid Other

## 2011-02-14 ENCOUNTER — Encounter (HOSPITAL_COMMUNITY): Payer: Medicaid Other

## 2011-02-16 ENCOUNTER — Encounter (HOSPITAL_COMMUNITY): Payer: Medicaid Other

## 2011-02-19 ENCOUNTER — Encounter (HOSPITAL_COMMUNITY): Payer: Medicaid Other

## 2011-02-19 ENCOUNTER — Ambulatory Visit: Payer: Medicaid Other | Admitting: Cardiovascular Disease

## 2011-02-21 ENCOUNTER — Encounter (HOSPITAL_COMMUNITY): Payer: Medicaid Other

## 2011-02-22 ENCOUNTER — Ambulatory Visit: Payer: Medicaid Other | Admitting: Cardiovascular Disease

## 2011-02-23 ENCOUNTER — Encounter (HOSPITAL_COMMUNITY): Payer: Medicaid Other

## 2011-02-26 ENCOUNTER — Encounter (HOSPITAL_COMMUNITY): Payer: Medicaid Other

## 2011-02-26 ENCOUNTER — Encounter: Payer: Medicaid Other | Admitting: Thoracic Surgery (Cardiothoracic Vascular Surgery)

## 2011-02-27 NOTE — Assessment & Plan Note (Signed)
[facility]                        [place] [facility]ARDIOLOG[facility] OFFICE NOTE   NAME:Ann Hawkins, Ann Hawkins                           MRN:          [id number]  DATE:07/18/2010                            DOB:          16-Jan-1976    Ann Hawkins is a 35 year old female who is here today for a followup visit.  She has the following problems list:  1. Coronary artery disease status post coronary artery bypass graft      surgery in 2006, with multiple subsequent angioplasty and stent      placement.  Most recent cardiac catheterization was done in      December 2010 at [facility]  At that time,      catheterization showed total occlusion of the native left anterior      descending coronary artery, left circumflex, and right coronary      artery.  Left internal mammary artery graft to left anterior      descending coronary artery was patent.  There was significant      disease in saphenous vein graft to obtuse marginal 1 and saphenous      vein graft to right posterior descending coronary artery.  She      underwent angioplasty and Xience drug-eluting stent placement to      saphenous vein graft to obtuse marginal 1 as well as saphenous vein      graft to right posterior descending coronary artery.  2. Peripheral arterial disease status post bilateral femoral-popliteal      bypass in 2006.  All these surgeries were done in [place].  3. Type 2 diabetes.  4. Bleeding ulcer in the esophagus  5. Hypertension.  6. Obesity.  7. Tobacco use.  8. History of noncompliance  9. Abdominal hernia.   INTERVAL HISTORY:  Ann Hawkins has not been doing very well recently.  Most  recent cardiac catheterization was in December of last year.  At that  time, she has had stent placement to 2 vein grafts.  She had improvement  in her chest pain at that time.  However, over the last few months, she  had recurrent chest pain which she describes as tightness which happens  with physical  activities as well as emotional stress similar to her  previous symptoms.  She also complains of significant dyspnea.  She said  that she has history of pericardial effusion and was supposed to have a  followup testing done for it.  She is not able to do much exercise and  is very limited.  She complains of bilateral discomfort in both legs, in  the buttocks, as well as all the way down.  She cut down on her smoking  and now she is down to half a pack per day.   MEDICATIONS:  1. Plavix 75 mg once daily.  2. Crestor 40 mg at bedtime.  3. Lisinopril 20 mg once daily.  4. Isosorbide 60 mg once daily.  5. Lasix 40 mg twice daily.  6. Percocet as needed.  7. Insulin Lantus.  8. Metoprolol 50 mg once daily.  9. Verapamil 80 mg twice daily for suspected spasm.  10.Ambien at bedtime as needed.   PHYSICAL EXAMINATION:  GENERAL:  The patient appears to be older than  her stated age.  She seems to be in no acute distress.  VITAL SIGNS:  Weight is 180 pounds, blood pressure is 119/73, pulse is  77, oxygen saturation is 97% on room air.  HEENT:  Normocephalic, atraumatic.  NECK:  No JVD or carotid bruits.  RESPIRATORY:  Normal respiratory effort with no use of accessory  muscles.  Auscultation reveals normal breath sounds.  CARDIOVASCULAR:  Normal PMI.  Normal S1 and S2 with no gallops or  murmurs.  ABDOMEN:  Benign, nontender, nondistended.  However, there is a large  abdominal hernia.  EXTREMITIES:  No clubbing, cyanosis, or edema.  Pedal pulses are very  faint bilaterally.  SKIN:  Cool and dry with no rash.  PSYCHIATRIC:  She is alert and oriented x3 with normal mood and affect.  MUSCULOSKELETAL:  She has normal muscle strength in the upper and lower  extremities.   IMPRESSION:  1. Coronary artery disease with recurrent chest pain manifested as      tightness similar to her previous angina.  The patient has      extensive cardiac history.  Unfortunately, she continues to smoke.       She had improvement in her chest pain after a recent angioplasty to      both vein grafts in December of last year.  However, now she is      having similar symptoms, highly suggestive of underlying ischemic      heart disease.  I recommend proceeding with Lexiscan nuclear stress      test for further evaluation.  The patient is not able to do      treadmill due to her significant leg discomfort due to      claudication.  We will continue with Plavix once daily.  To my      understanding, she was taken off aspirin in the past due to      bleeding ulcer in her esophagus.  Obviously if angioplasty is      needed again in the near future, we will have to resume at least 81-      mg aspirin.  2. Hyperlipidemia:  She will need an updated lipid profile.  She is      currently on Crestor 40 mg once daily.  3. Hypertension.  Her blood pressure is well controlled.  4. Tobacco use:  I had an extensive discussion with her about      importance of smoking cessation.  The patient once tried to do it,      kept down gradually.  She used to smoke 2 packs per day and now she      is down to half a pack per day.  She will follow up with me after      her cardiac workup.  5. Significant dyspnea with previous history of pericardial effusion.      We will obtain an echocardiogram for further evaluation.  I advised      the patient to establish with a primary care physician as well as      pain management specialist.  She requested that I fill her pain      medications.  However, I explained to her that is outside the scope      of my practice.  6. PAD: will need in ABI  in near future given her claudication.     Lorine Bears, MD  Electronically Signed   MA/MedQ  DD: 07/18/2010  DT: 07/18/2010  Job #: [id number]

## 2011-02-27 NOTE — Assessment & Plan Note (Signed)
[facility]                        [place] [facility] OFFICE NOTE   NAME:Ann Hawkins, Ann Hawkins                           MRN:          [id number]  DATE:03/14/2010                            DOB:          Nov 20, 1975    CHIEF COMPLAINT:  Establishing cardiovascular care.   HISTORY OF PRESENT ILLNESS:  The patient is a 35 year old white female  with past medical history significant for coronary artery disease status  post 5-vessel CABG in 2006, with multiple subsequent PCIs, the last of  which was in October 2010, peripheral arterial disease status post  bilateral fem-pop bypasses also in 2006, bleeding ulcer in her  esophagus, hypertension, obesity, ongoing tobacco use, history of  noncompliance, who is presenting to establish cardiovascular care.  The  patient was recently followed by [facility], however, they  have asked her to find a different cardiologist secondary to her issues  with missing appointments and noncompliance.  She states that  approximately 4 days ago, she ran out of several of her medications and  since then, she has had increased frequency of substernal stabbing chest  discomfort that radiates to the right arm.  It is self-limiting and has  been occurring a couple of times a day.  She states the Imdur which she  is supposed to be taking seems to control this type of discomfort.  Prior to 4 days ago, she states that she was experiencing chest  discomfort, maybe 1 time a week and that was responsive to  nitroglycerin.  The patient also endorses cramping in her legs when  walking approximately 10 feet.  This is especially in the right lower  extremity and the left buttock area.  She states that has been present  for the past month or so.  Approximately 1 month ago, she was seen at  [facility], at which time she states she had an echocardiogram  and evaluation of her peripheral bypass grafts.  They told her these  bypass  grafts require further intervention, however, because she was on  Plavix, they did not want to operate on her.  This is all per the  patient's report, however, the medical records are not currently  available.   PAST MEDICAL HISTORY:  As in HPI.  The patient also carries a diagnosis  of asthma.   SOCIAL HISTORY:  She continues to smoke tobacco.  She does not drink  alcohol.   FAMILY HISTORY:  Positive for premature coronary disease.   ALLERGIES:  No known drug allergies.   MEDICATIONS:  1. Plavix 75 mg daily.  2. Lopressor 25 mg b.i.d.  3. Crestor 40 mg every evening.  4. Lisinopril 20 mg everyday.  5. Imdur 60 mg daily.  6. Lasix 40 mg b.i.d.  7. Verapamil 60 mg daily.  8. Percocet p.r.n.  9. Lantus insulin.  10.Glipizide.  11.Nitro spray.  12.The patient is not taking aspirin as she thinks perhaps because of      the issues with esophageal ulcers and bleeding.   REVIEW OF SYSTEMS:  As in HPI.  All other systems reviewed and  are  negative excpet the patient states that she was told she had fluid  around her heart approximately 1 month ago from 1 of the imaging studies  performed at [facility].   PHYSICAL EXAMINATION:  VITAL SIGNS:  The patient is afebrile.  Blood  pressure is 119/76, pulse 78, saturating 99% on room air.  She weighs  171 pounds.  GENERAL:  No acute distress.  HEENT:  Normocephalic, atraumatic.  NECK:  Supple.  No JVD.  No carotid bruits.  HEART:  Regular rate and rhythm without murmur, rub, or gallop.  LUNGS:  Clear.  ABDOMEN:  Soft.  There is mild gastric tenderness.  There is an area of  warmth over her abdominal hernia.  EXTREMITIES:  Without edema.  Pulses 2+ left-sided dorsalis pedis and 1+  right-sided dorsalis pedis pulse.  SKIN:  Cool and dry.  PSYCHIATRIC:  The patient is appropriate.  MUSCULOSKELETAL:  5/5 bilateral upper and lower extremity strength.   EKG taken today in clinic independently interpreted by myself  demonstrates normal  sinus rhythm with inferior Q-waves.  There are  nonspecific T-wave abnormalities and possible left atrial enlargement.   ASSESSMENT AND PLAN:  1. Coronary disease.  The patient has been having an increasing chest      pain over the past 4 days since she has stopped taking several of      her medications.  We will refill her medications today and she      should continue on Plavix, Lopressor, Crestor, lisinopril, Imdur,      and verapamil.  She states that the verapamil was prescribed in      case she was having vasospasm as an etiology.  She is not on dual-      antiplatelet therapy presumably for a history of bleeding      esophageal ulcer.  For this reason, we will leave her on Plavix      alone.  2. Peripheral arterial disease.  The patient has symptoms consistent      with claudication and has been revascularized in the past with a      bilateral fem-pop per her.  We will get the result of the most      recent imaging studies from [facility] 1 month ago.  3. Hypertension.  Blood pressure is well controlled on current      medications which she should continue.  4. Hyperlipidemia.  We will check a fasting lipid profile today.  5. The patient states she has fluid around her heart approximately 1      month ago.  We will need to get all of her records from [facility] including previous stress test, echocardiograms, and CT      scans.  Further evaluation will be based on those findings.  We      also recommend that she follow up quickly with Dr. Clovis Riley or a      surgeon regarding hernia      as her abdomen is mildly tender and she does have an area of warmth      over the hernia.  We will see the patient back in approximately 1      months' time.     Brayton El, MD  Electronically Signed    SGA/MedQ  DD: 03/14/2010  DT: 03/14/2010  Job #: [id number]

## 2011-02-27 NOTE — Assessment & Plan Note (Signed)
[facility]                        [place] CARDIOLOGY OFFICE NOTE   NAME:Ann Hawkins, Ann Hawkins                           MRN:          [id number]  DATE:09/29/2010                            DOB:          09-Aug-1976    Ms. Howell is a 35 year old female with extensive medical problems.  She  is here for a followup visit.  She has the following problem list:  1. Coronary artery disease, status post coronary artery bypass graft      surgery in 2006, with multiple subsequent angioplasty and stent      placement.  Most recent cardiac catheterization was done in      December 2010 at [facility].  At that time,      catheterization showed total occlusion of LAD, left circumflex and      right coronary artery.  The LIMA to LAD was patent.  There was      significant disease and SVG to OM1 and SVG to right PDA.  She      underwent angioplasty and a drug-eluting stent placement to both      grafts.  2. Peripheral arterial disease, status post bilateral femoral-      popliteal bypass in 2006.  This was done in [place].  3. Type 2 diabetes.  4. Bleeding ulcer in the esophagus.  5. Hypertension.  6. Obesity.  7. Tobacco use.  8. History of noncompliance.  9. Abdominal hernia.   INTERVAL HISTORY:  I saw the patient back in October and at that time  she was complaining of significant worsening of her chest pain.  I  requested a stress test and an echocardiogram.  However, she cancelled  both appointments and did not reschedule.  She is here today due to an  increased frequency of chest tightness which has been requiring  nitroglycerin frequently.  This happens at rest and with activities.  She still has significant dyspnea.  She was also noted recently to have  a bruit in the right side of her neck.  She also wants to have clearance  done in order to have her hernia surgery performed.  Unfortunately, she  continues to smoke.  She has been taking her  medications regularly.  She  mentioned that she had blood in the stool last time and she was  scheduled for the stress test and that was the reason for cancelling it.   MEDICATIONS:  1. Plavix 75 mg once daily.  2. Crestor 40 mg at bedtime.  3. Lisinopril 20 mg daily.  4. Isosorbide 60 mg daily.  5. Lasix 40 mg twice daily.  6. Insulin Lantus.  7. Metoprolol 50 mg once daily.  8. Verapamil 80 mg twice daily.  9. Ambien at bedtime.  10.Neurontin 600 mg t.i.d.  11.Fish oil.   ALLERGIES:  No known drug allergies.   PHYSICAL EXAMINATION:  GENERAL:  The patient appears to be older than  her stated age, but in no acute distress.  VITAL SIGNS:  Weight is 184.2 pounds, blood pressure is 105/69, pulse is  81, oxygen saturation is  98% on room air.  RESPIRATORY:  Normal respiratory effort with no use of accessory  muscles.  Auscultation reveals normal breath sounds.  NECK:  There is a faint carotid bruit on the right side.  It is normal  on the left side.  CARDIOVASCULAR:  Normal PMI.  Normal S1 and S2 with no gallops or  murmurs.  ABDOMEN:  Benign, nontender, nondistended.  However, there is a large  abdominal hernia.  EXTREMITIES:  No clubbing, cyanosis or edema.  Pedal pulses are very  faint bilaterally.  SKIN:  Cool and dry with no rash.  PSYCHIATRIC:  The patient is alert, oriented x3 with normal mood.  She  does seem to be anxious.   IMPRESSION:  1. Coronary artery disease with worsening chest pain requiring      frequent use of nitroglycerin.  This is worrisome for progression      of coronary artery disease, likely in her vein grafts.  All her      native circulation is occluded.  The patient has been taking      Plavix, but unable to take aspirin due to history of upper      gastrointestinal bleed and ulcers.  I will go ahead and schedule      the patient for a Lexiscan nuclear stress test for further      evaluation.  Meantime, we will continue with current medications.   2. Significant dyspnea which could be due to her heart disease or      prolonged smoking history.  We will nonetheless, we will have to      evaluate for pulmonary hypertension or other valvular      abnormalities.  This will request the transthoracic echocardiogram.  3. Right carotid bruit is highly suggestive of carotid stenosis,      especially with her known history of extensive atherosclerosis      manifested by coronary artery disease and peripheral arterial      disease.  We will request carotid Doppler for further evaluation.  4. Tobacco use:  The patient was counseled again about the importance      of smoking cessation.  I had a frank discussion with her and her      husband.  Unfortunately, with all her medical conditions and      continued smoking her life expectancy is very shortened.  This was      explained to the patient.  She will follow up with me after her      cardiac workup to decide if she will be able to proceed with hernia      repair.     Lorine Bears, MD  Electronically Signed    MA/MedQ  DD: 09/29/2010  DT: 09/30/2010  Job #: [id number]

## 2011-02-27 NOTE — Letter (Signed)
Mar 14, 2010    Dr. Gabriel Cirri  [facility]  [phone]  [place], [place]  [address]   RE:  Ann Hawkins, Ann Hawkins  MRN:  [id number]  /  DOB:  13-Oct-1976   Dear Dr. Clovis Riley:   I had the pleasure of seeing Ann Hawkins in clinic this morning.  As you  know, she is a 35 year old female with history of premature coronary  disease as well as peripheral vascular disease.  She has had some issues  with noncompliance and continues tobacco use.  The patient endorses  increasing chest pain over the past 4 days; however, she had run out of  several of her medications 4 days ago.  She also endorses possible  claudication in her right lower extremity and left buttock area.  Evidently she had several imaging studies performed 1 month ago at [facility].  Today in clinic I have refilled all of her  cardiovascular medications and have asked for all of her records from  her previous cardiologist and [facility] to be faxed to my  office for review.  Lastly, I am concerned about the patient's abdominal  wall hernia.  Her abdomen was mildly tender on exam today, and she had a  clear area of warmth over the area of the hernia.  She was afebrile, and  I am checking a CBC.  I have asked her to contact you soon in order to  have the hernia evaluated.   Thank you for the referral of this patient and I look forward to  following her along with you.    Sincerely,      Brayton El, MD  Electronically Signed    SGA/MedQ  DD: 03/14/2010  DT: 03/14/2010  Job #: [id number]

## 2011-02-28 ENCOUNTER — Encounter (HOSPITAL_COMMUNITY): Payer: Medicaid Other

## 2011-03-02 ENCOUNTER — Encounter (HOSPITAL_COMMUNITY): Payer: Medicaid Other

## 2011-03-05 ENCOUNTER — Encounter (HOSPITAL_COMMUNITY): Payer: Medicaid Other

## 2011-03-07 ENCOUNTER — Encounter (HOSPITAL_COMMUNITY): Payer: Medicaid Other

## 2011-03-09 ENCOUNTER — Encounter (HOSPITAL_COMMUNITY): Payer: Medicaid Other

## 2011-03-12 ENCOUNTER — Encounter (HOSPITAL_COMMUNITY): Payer: Medicaid Other

## 2011-03-14 ENCOUNTER — Encounter (HOSPITAL_COMMUNITY): Payer: Medicaid Other

## 2011-03-16 ENCOUNTER — Other Ambulatory Visit: Payer: Self-pay | Admitting: Thoracic Surgery (Cardiothoracic Vascular Surgery)

## 2011-03-16 ENCOUNTER — Encounter (HOSPITAL_COMMUNITY): Payer: Medicaid Other

## 2011-03-16 DIAGNOSIS — I2510 Atherosclerotic heart disease of native coronary artery without angina pectoris: Secondary | ICD-10-CM

## 2011-03-19 ENCOUNTER — Encounter (HOSPITAL_COMMUNITY): Payer: Medicaid Other

## 2011-03-19 ENCOUNTER — Other Ambulatory Visit: Payer: Self-pay | Admitting: *Deleted

## 2011-03-19 ENCOUNTER — Encounter: Payer: Medicaid Other | Admitting: Thoracic Surgery (Cardiothoracic Vascular Surgery)

## 2011-03-19 MED ORDER — METOPROLOL SUCCINATE ER 25 MG PO TB24: 12.5000 mg | ORAL_TABLET | Freq: Every day | ORAL | Status: DC

## 2011-03-21 ENCOUNTER — Encounter (HOSPITAL_COMMUNITY): Payer: Medicaid Other

## 2011-03-23 ENCOUNTER — Encounter (HOSPITAL_COMMUNITY): Payer: Medicaid Other

## 2011-03-26 ENCOUNTER — Encounter (HOSPITAL_COMMUNITY): Payer: Medicaid Other

## 2011-03-27 ENCOUNTER — Telehealth: Payer: Self-pay | Admitting: Cardiovascular Disease

## 2011-03-27 NOTE — Telephone Encounter (Signed)
Pt wants NAME BRAND Plavix, not generic.  She is taking generic now and has had chest pain ever since.  Please call [facility] with the new prescription.  Has an appt 6-25.

## 2011-03-27 NOTE — Telephone Encounter (Signed)
Spoke with Pt -- says she is having chest pains since switching to generic -- when I asked what type of pain she said "tightness whenever her man stresses her out which is most of the time".  Called Dr Kirke Corin -- he is not sure why that would be happening and will see her on 6/25 as scheduled to discuss it (she has missed last 2 appointments). Per pharmacy if he switches her to brand name it must be a new script written with his signature and brand medically necessary written on the front with his handwriting, and he had to go on the website and document that she has had an adverse reaction ( www.documentforsafety.org)

## 2011-03-28 ENCOUNTER — Encounter (HOSPITAL_COMMUNITY): Payer: Medicaid Other

## 2011-03-30 ENCOUNTER — Encounter (HOSPITAL_COMMUNITY): Payer: Medicaid Other

## 2011-04-02 ENCOUNTER — Encounter (HOSPITAL_COMMUNITY): Payer: Medicaid Other

## 2011-04-02 ENCOUNTER — Other Ambulatory Visit: Payer: Self-pay | Admitting: *Deleted

## 2011-04-02 MED ORDER — ROSUVASTATIN CALCIUM 40 MG PO TABS: 40.0000 mg | ORAL_TABLET | Freq: Every day | ORAL | Status: DC

## 2011-04-04 ENCOUNTER — Encounter (HOSPITAL_COMMUNITY): Payer: Medicaid Other

## 2011-04-06 ENCOUNTER — Encounter (HOSPITAL_COMMUNITY): Payer: Medicaid Other

## 2011-04-09 ENCOUNTER — Ambulatory Visit (INDEPENDENT_AMBULATORY_CARE_PROVIDER_SITE_OTHER): Payer: Medicaid Other | Admitting: Cardiovascular Disease

## 2011-04-09 ENCOUNTER — Encounter: Payer: Self-pay | Admitting: Cardiovascular Disease

## 2011-04-09 ENCOUNTER — Encounter (HOSPITAL_COMMUNITY): Payer: Medicaid Other

## 2011-04-09 VITALS — BP 145/97 | HR 84 | Ht 61.0 in | Wt 187.0 lb

## 2011-04-09 DIAGNOSIS — E119 Type 2 diabetes mellitus without complications: Secondary | ICD-10-CM

## 2011-04-09 DIAGNOSIS — I10 Essential (primary) hypertension: Secondary | ICD-10-CM

## 2011-04-09 DIAGNOSIS — I2510 Atherosclerotic heart disease of native coronary artery without angina pectoris: Secondary | ICD-10-CM

## 2011-04-09 DIAGNOSIS — E785 Hyperlipidemia, unspecified: Secondary | ICD-10-CM

## 2011-04-09 MED ORDER — POTASSIUM CHLORIDE 10 MEQ PO TBCR: 20.0000 meq | EXTENDED_RELEASE_TABLET | Freq: Every day | ORAL | Status: AC

## 2011-04-09 MED ORDER — LISINOPRIL 20 MG PO TABS: 20.0000 mg | ORAL_TABLET | Freq: Every day | ORAL | Status: DC

## 2011-04-09 MED ORDER — INSULIN GLARGINE 100 UNIT/ML ~~LOC~~ SOLN: 24.0000 [IU] | Freq: Two times a day (BID) | SUBCUTANEOUS | Status: AC

## 2011-04-09 MED ORDER — METOPROLOL SUCCINATE ER 100 MG PO TB24: 100.0000 mg | ORAL_TABLET | Freq: Every day | ORAL | Status: DC

## 2011-04-09 MED ORDER — FUROSEMIDE 40 MG PO TABS: 40.0000 mg | ORAL_TABLET | Freq: Every day | ORAL | Status: DC

## 2011-04-09 MED ORDER — ROSUVASTATIN CALCIUM 40 MG PO TABS: 40.0000 mg | ORAL_TABLET | Freq: Every day | ORAL | Status: DC

## 2011-04-09 MED ORDER — ZOLPIDEM TARTRATE 10 MG PO TABS: 10.0000 mg | ORAL_TABLET | Freq: Every evening | ORAL | Status: AC | PRN

## 2011-04-09 MED ORDER — CLOPIDOGREL BISULFATE 75 MG PO TABS: 75.0000 mg | ORAL_TABLET | Freq: Every day | ORAL | Status: DC

## 2011-04-09 MED ORDER — GABAPENTIN 600 MG PO TABS: 600.0000 mg | ORAL_TABLET | Freq: Three times a day (TID) | ORAL | Status: AC

## 2011-04-09 NOTE — Assessment & Plan Note (Signed)
I advised her to establish with a primary care physician due to her multiple other medical conditions including diabetes as well as chronic pain management. I explained to her that I cannot refill her narcotic pain medication.

## 2011-04-09 NOTE — Patient Instructions (Signed)
Your physician recommends that you schedule a follow-up appointment in: 3 months  Your physician recommends that you return for lab work in: next week  Your physician has recommended you make the following change in your medication: start Metoprolol 100 mg daily and Lisinopril 20 mg daily

## 2011-04-09 NOTE — Assessment & Plan Note (Signed)
Continue Crestor 40 mg daily. Check fasting lipid and liver profile.

## 2011-04-09 NOTE — Assessment & Plan Note (Signed)
Her blood pressure has been running high. Would keep her on metoprolol succinate 100 mg once daily. I will go ahead and resume lisinopril 20 mg once daily. We'll check her renal function in one week.

## 2011-04-09 NOTE — Assessment & Plan Note (Signed)
She seems to be stable overall with no clear symptoms of angina. She has chronic chest and generalized pain. She is on oxycodone. This clearly seems to be musculoskeletal. The plan is to keep her on life long dual antiplatelet therapy. Continue aggressive treatment of risk factors.

## 2011-04-09 NOTE — Progress Notes (Signed)
HPI  This is a 35 year old female who is here today for a followup visit. She has extensive medical problems that include coronary artery disease status post CABG twice as well as peripheral arterial disease status post aorta bifemoral bypass surgery, type 2 diabetes, moderate bilateral carotid stenosis and previous history of tobacco use. She missed her most recent appointment. Overall she has been doing reasonably well. She quit smoking since her last surgery. She has chronic pain requiring narcotic medications. She still has significant discomfort at the surgical site. Her blood pressure has been running high recently. The patient is trying to establish with another primary care physician. She ran out of some of her medications and needs refills.  Allergies  Allergen Reactions  . Penicillins      Current Outpatient Prescriptions on File Prior to Visit  Medication Sig Dispense Refill  . aspirin 81 MG tablet Take 81 mg by mouth daily.        . clopidogrel (PLAVIX) 75 MG tablet Take 75 mg by mouth daily.        . furosemide (LASIX) 40 MG tablet Take 40 mg by mouth daily.        Marland Kitchen gabapentin (NEURONTIN) 600 MG tablet Take 600 mg by mouth 3 (three) times daily.        . insulin glargine (LANTUS) 100 UNIT/ML injection Inject 24 Units into the skin 2 (two) times daily.        Marland Kitchen oxyCODONE (OXYCONTIN) 10 MG 12 hr tablet Take 5 mg by mouth every 12 (twelve) hours.        . potassium chloride (KLOR-CON) 10 MEQ CR tablet Take 20 mEq by mouth daily.        . rosuvastatin (CRESTOR) 40 MG tablet Take 1 tablet (40 mg total) by mouth daily.  30 tablet  6  . zolpidem (AMBIEN) 10 MG tablet Take 10 mg by mouth at bedtime as needed.        Marland Kitchen DISCONTD: fish oil-omega-3 fatty acids 1000 MG capsule Take 2 g by mouth daily.        Marland Kitchen DISCONTD: metoprolol succinate (TOPROL-XL) 25 MG 24 hr tablet Take 1 tablet (25 mg total) by mouth daily.  30 tablet  12  . DISCONTD: nitroGLYCERIN (NITROSTAT) 0.4 MG SL tablet Place 0.4  mg under the tongue every 5 (five) minutes as needed.           Past Medical History  Diagnosis Date  . Coronary artery disease 2006    sp CABG in 2006  . Peripheral arterial disease 2006    sp bypass surgery in 2006  . Diabetes mellitus   . Hypertension   . Obesity   . Ulcer of esophagus with bleeding   . Tobacco use disorder, continuous   . MI, acute, non ST segment elevation Jan. 2012  . Hyperlipidemia   . Hiatal hernia   . SOB (shortness of breath)   . Problem with medical care compliance   . Carotid artery occlusion     bilateral moderate carotid stenosis     Past Surgical History  Procedure Date  . Cardiac catheterization Jan.2012    occluded native coronary arteries. Patent LIMA to LAD. Occluded SVG to RCA,. SVG to OM: 90%distal ISR  . Coronary angioplasty with stent placement     Multiple PCI on SVGs before redo.   . Coronary artery bypass graft 2006, 2012    Redo: SVG to OM and SVG to RPDA (very poor target, likely nonfunctional).  Not a redo candidate     No family history on file.   History   Social History  . Marital Status: Single    Spouse Name: N/A    Number of Children: N/A  . Years of Education: N/A   Occupational History  . Not on file.   Social History Main Topics  . Smoking status: Former Smoker -- 1.5 packs/day for 12 years    Types: Cigarettes    Quit date: 11/08/2010  . Smokeless tobacco: Not on file  . Alcohol Use: No  . Drug Use: No  . Sexually Active: Not on file   Other Topics Concern  . Not on file   Social History Narrative  . No narrative on file       PHYSICAL EXAM   BP 145/97  Pulse 84  Ht 5\' 1"  (1.549 m)  Wt 187 lb (84.823 kg)  BMI 35.33 kg/m2  SpO2 97%  Constitutional: She is oriented to person, place, and time. She appears well-developed and well-nourished. No distress.  HENT: No nasal discharge.  Head: Normocephalic and atraumatic.  Eyes: Pupils are equal, round, and reactive to light. Right eye  exhibits no discharge. Left eye exhibits no discharge.  Neck: Normal range of motion. Neck supple. No JVD present. No thyromegaly present. There is a carotid bruit on the left side Cardiovascular: Normal rate, regular rhythm, normal heart sounds . Exam reveals no gallop and no friction rub.  No murmur heard.  Pulmonary/Chest: Effort normal and breath sounds normal. No stridor. No respiratory distress. She has no wheezes. She has no rales. She exhibits no tenderness.  Abdominal: Soft. Bowel sounds are normal. She exhibits no distension. There is no tenderness. There is no rebound and no guarding. There is a large abdominal hernia  Musculoskeletal: Normal range of motion. She exhibits no edema and no tenderness.  Neurological: She is alert and oriented to person, place, and time. Coordination normal.  Skin: Skin is warm and dry. No rash noted. She is not diaphoretic. No erythema. No pallor.  Psychiatric: She has a normal mood and affect. Her behavior is normal. Judgment and thought content normal.      ASSESSMENT AND PLAN

## 2011-04-11 ENCOUNTER — Encounter (HOSPITAL_COMMUNITY): Payer: Medicaid Other

## 2011-04-13 ENCOUNTER — Encounter (HOSPITAL_COMMUNITY): Payer: Medicaid Other

## 2011-04-16 ENCOUNTER — Encounter (HOSPITAL_COMMUNITY): Payer: Medicaid Other

## 2011-04-18 ENCOUNTER — Encounter (HOSPITAL_COMMUNITY): Payer: Medicaid Other

## 2011-04-20 ENCOUNTER — Encounter (HOSPITAL_COMMUNITY): Payer: Medicaid Other

## 2011-04-23 ENCOUNTER — Encounter (HOSPITAL_COMMUNITY): Payer: Medicaid Other

## 2011-04-25 ENCOUNTER — Encounter (HOSPITAL_COMMUNITY): Payer: Medicaid Other

## 2011-04-27 ENCOUNTER — Ambulatory Visit (HOSPITAL_COMMUNITY): Payer: Medicaid Other

## 2011-04-27 ENCOUNTER — Telehealth: Payer: Self-pay | Admitting: Cardiovascular Disease

## 2011-04-27 NOTE — Telephone Encounter (Signed)
Pharmacist states Pt wants to change insulin to pen insulin.

## 2011-04-27 NOTE — Telephone Encounter (Signed)
Let them know that we do not follow insulin and she told me last time she was in that she would be going to [facility], I am not sure which Dr she will be seeing tho.

## 2011-05-10 ENCOUNTER — Ambulatory Visit: Payer: Medicaid Other | Admitting: Cardiovascular Disease

## 2011-05-14 ENCOUNTER — Encounter: Payer: Self-pay | Admitting: Cardiovascular Disease

## 2011-05-25 ENCOUNTER — Telehealth: Payer: Self-pay | Admitting: Cardiovascular Disease

## 2011-05-25 ENCOUNTER — Telehealth: Payer: Self-pay | Admitting: *Deleted

## 2011-05-25 NOTE — Telephone Encounter (Signed)
s/w pharmacist and we both said that pt's cardiac meds have just been filled and are not due for refills. Pharmacist states pt has been there 4 x's today looking for her medications from Dr. Genia Del her xanax and another medication. Danielle Rankin

## 2011-05-25 NOTE — Telephone Encounter (Signed)
Pt needs all her heart meds to be call in to her pharmacy.

## 2011-06-20 ENCOUNTER — Other Ambulatory Visit: Payer: Self-pay | Admitting: Cardiovascular Disease

## 2011-06-21 NOTE — Telephone Encounter (Signed)
Pt has no good phone numbers --- Ambien should come from PCP

## 2011-06-26 ENCOUNTER — Telehealth: Payer: Self-pay | Admitting: Cardiovascular Disease

## 2011-06-26 NOTE — Telephone Encounter (Signed)
This Pt has no good numbers ---- she needs to call her primary for refills on this... We do not refill muscle relaxer's.

## 2011-06-26 NOTE — Telephone Encounter (Signed)
Pt wants refill of Soma at [facility]

## 2011-07-04 ENCOUNTER — Encounter (INDEPENDENT_AMBULATORY_CARE_PROVIDER_SITE_OTHER): Payer: Self-pay | Admitting: Surgery

## 2011-07-06 ENCOUNTER — Encounter: Payer: Self-pay | Admitting: Cardiovascular Disease

## 2011-07-09 ENCOUNTER — Ambulatory Visit: Payer: Medicaid Other | Admitting: Cardiovascular Disease

## 2011-07-09 ENCOUNTER — Ambulatory Visit (INDEPENDENT_AMBULATORY_CARE_PROVIDER_SITE_OTHER): Payer: Medicaid Other | Admitting: Surgery

## 2011-07-10 ENCOUNTER — Encounter: Payer: Self-pay | Admitting: Thoracic Surgery (Cardiothoracic Vascular Surgery)

## 2011-07-11 ENCOUNTER — Other Ambulatory Visit: Payer: Self-pay | Admitting: Thoracic Surgery (Cardiothoracic Vascular Surgery)

## 2011-07-11 DIAGNOSIS — I2510 Atherosclerotic heart disease of native coronary artery without angina pectoris: Secondary | ICD-10-CM

## 2011-07-16 ENCOUNTER — Ambulatory Visit: Payer: Medicaid Other | Admitting: Thoracic Surgery (Cardiothoracic Vascular Surgery)

## 2011-07-24 ENCOUNTER — Encounter (INDEPENDENT_AMBULATORY_CARE_PROVIDER_SITE_OTHER): Payer: Self-pay | Admitting: Surgery

## 2011-09-03 ENCOUNTER — Encounter: Payer: Self-pay | Admitting: *Deleted

## 2011-09-17 ENCOUNTER — Telehealth: Payer: Self-pay | Admitting: Cardiovascular Disease

## 2011-09-17 NOTE — Telephone Encounter (Signed)
New problem Pt wants authorization for crestor Called to [facility]

## 2011-09-18 NOTE — Telephone Encounter (Signed)
The numbers for her and her emergency contact are not good numbers. Called [facility] and medicaid stopped paying for Crestor. I told them she does not follow with [place] any longer since we are not in [place] anymore, this would need to come from the Dr she sees now.

## 2011-09-20 ENCOUNTER — Other Ambulatory Visit: Payer: Self-pay | Admitting: Cardiovascular Disease

## 2011-09-20 ENCOUNTER — Telehealth: Payer: Self-pay

## 2011-09-20 NOTE — Telephone Encounter (Signed)
Crestor is no longer a preferred drug through her medicaid.  The only generics that are accepted are: Atorvastatin, Lovastatin, Pravastatin and Simvastatin.  Do you want to switch her to one of those or have her pay out of pocket for the Crestor?  Thanks.

## 2011-09-20 NOTE — Telephone Encounter (Signed)
Taken care of by Hardin Negus.

## 2011-09-21 ENCOUNTER — Other Ambulatory Visit: Payer: Self-pay | Admitting: *Deleted

## 2011-09-21 MED ORDER — ATORVASTATIN CALCIUM 40 MG PO TABS: 40.0000 mg | ORAL_TABLET | Freq: Every day | ORAL | Status: DC

## 2011-09-28 ENCOUNTER — Telehealth: Payer: Self-pay | Admitting: *Deleted

## 2011-09-28 NOTE — Telephone Encounter (Signed)
Pt called stating she was recently in the hospital (?ER) in [facility]. Per pt, they told her to see Dr. Kirke Corin ASAP for another cath. She states she recently had CABG and shouldn't be having pain like this again because she didn't start back smoking. 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 Offered to schedule pt w/Dr. Kirke Corin in [place], but pt refused b/c it is too far to drive. She will see Dr. Jens Som in [facility] on 10/03/11 at 11:30. Pt confirmed appt and is agreeable to this plan.

## 2011-10-01 ENCOUNTER — Telehealth: Payer: Self-pay

## 2011-10-01 ENCOUNTER — Encounter: Payer: Self-pay | Admitting: *Deleted

## 2011-10-01 NOTE — Telephone Encounter (Signed)
Received phone call from office manager Azucena Freed [facility] office.She stated patient did not want to come to [facility] to far to drive,states patient has appointment with Dr. Jens Som this wed 10/03/11 at [facility] office.Patient out of Crestor and medicaid will not approve.Patient also was to have lipids checked this past 6/12 and was not done,wanting prescription called in.Advised to have patient keep appointment with Dr. Jens Som 10/03/11 and a prescription for crestor can be given.Ginette Pitman wanted me to call patient back and advise.Patient was called back,boyfriend answered phone upset that patient has been out of crestor for 7 days, stated he just found out medicaid will approve crestor.I ask to speak with patient.Patient answered phone,upset stated she has not taken crestor in 7 days and was told she could have a heart attack if she did not take crestor.Stated she did not have lipids checked in 6/12 she was in hospital.Stated she would sue if she has a heart attack.Patient advised to keep appointment with Dr. Jens Som this wed 10/03/11,and he would give her a prescription for crestor and order lipid panel.

## 2011-10-02 NOTE — Telephone Encounter (Signed)
Fu tomorrow as scheduled Ann Hawkins

## 2011-10-03 ENCOUNTER — Ambulatory Visit: Payer: Medicaid Other | Admitting: Cardiology

## 2011-10-11 ENCOUNTER — Other Ambulatory Visit: Payer: Self-pay | Admitting: Cardiology

## 2011-10-11 MED ORDER — CLOPIDOGREL BISULFATE 75 MG PO TABS: 75.0000 mg | ORAL_TABLET | Freq: Every day | ORAL | Status: DC

## 2011-10-11 MED ORDER — METOPROLOL SUCCINATE ER 100 MG PO TB24: 100.0000 mg | ORAL_TABLET | Freq: Every day | ORAL | Status: AC

## 2011-11-09 IMAGING — CR DG CHEST 1V PORT
1 series · 1 of 1 positions shown · non-contrast
Comparison: 10/24/2010

CLINICAL DATA: Unstable angina and CABG procedure.

PORTABLE CHEST - 1 VIEW

[AP]
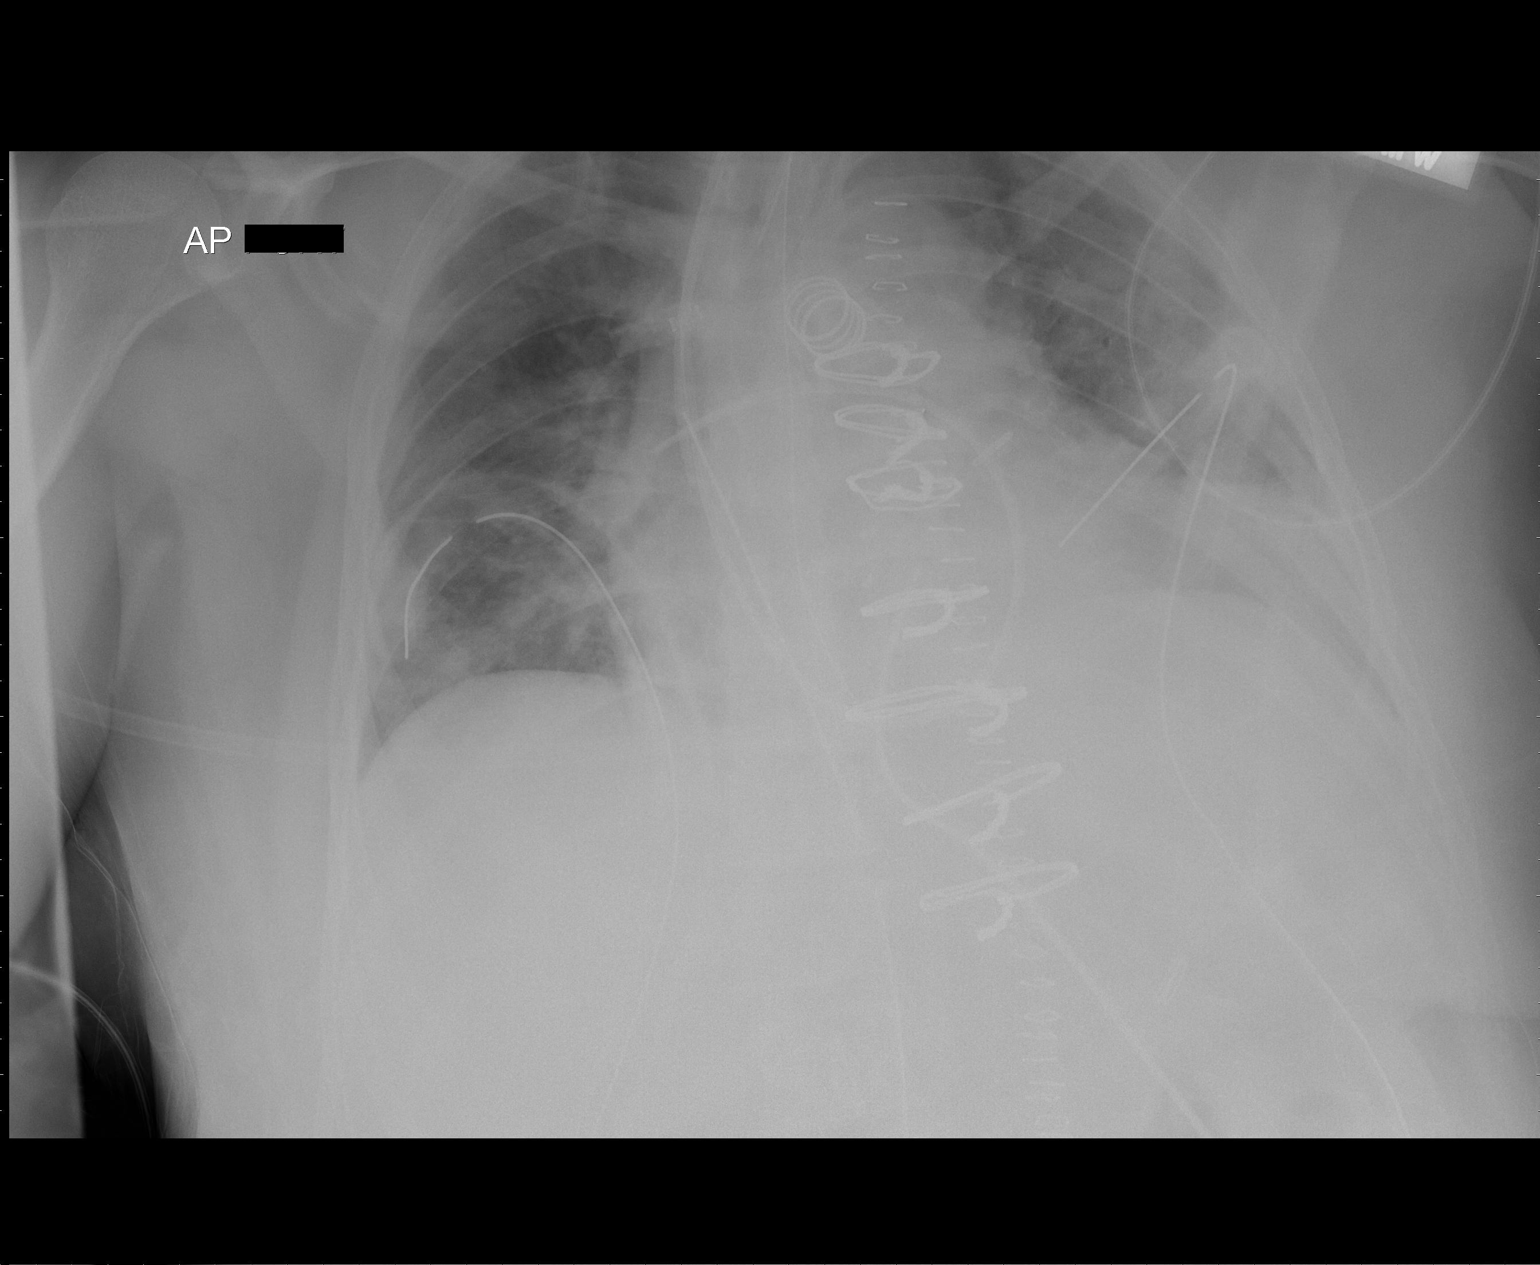

[1 of 1 positions shown; findings below may reference images not displayed]

FINDINGS: Endotracheal tube is 4.0 cm above the carina.  There is
improved aeration of the lungs suggesting decreased pulmonary
edema.  Nasogastric extends into the proximal stomach.  Stable
position of the mediastinal and chest tubes.  No evidence for large
pneumothorax.  Low lung volumes.  Pulmonary artery catheter is in
the distal right pulmonary artery region.  There may be an
additional right jugular central venous catheter with the tip in
the SVC.
IMPRESSION: Postoperative changes with slightly improved aeration.  Findings
may represent decreasing edema.

Support apparatuses as described.

## 2011-11-12 IMAGING — CR DG CHEST 2V
2 series · 2 of 2 positions shown · non-contrast
Comparison: 10/27/2010

CLINICAL DATA: Status post CABG.

CHEST - 2 VIEW

[w chest pa]
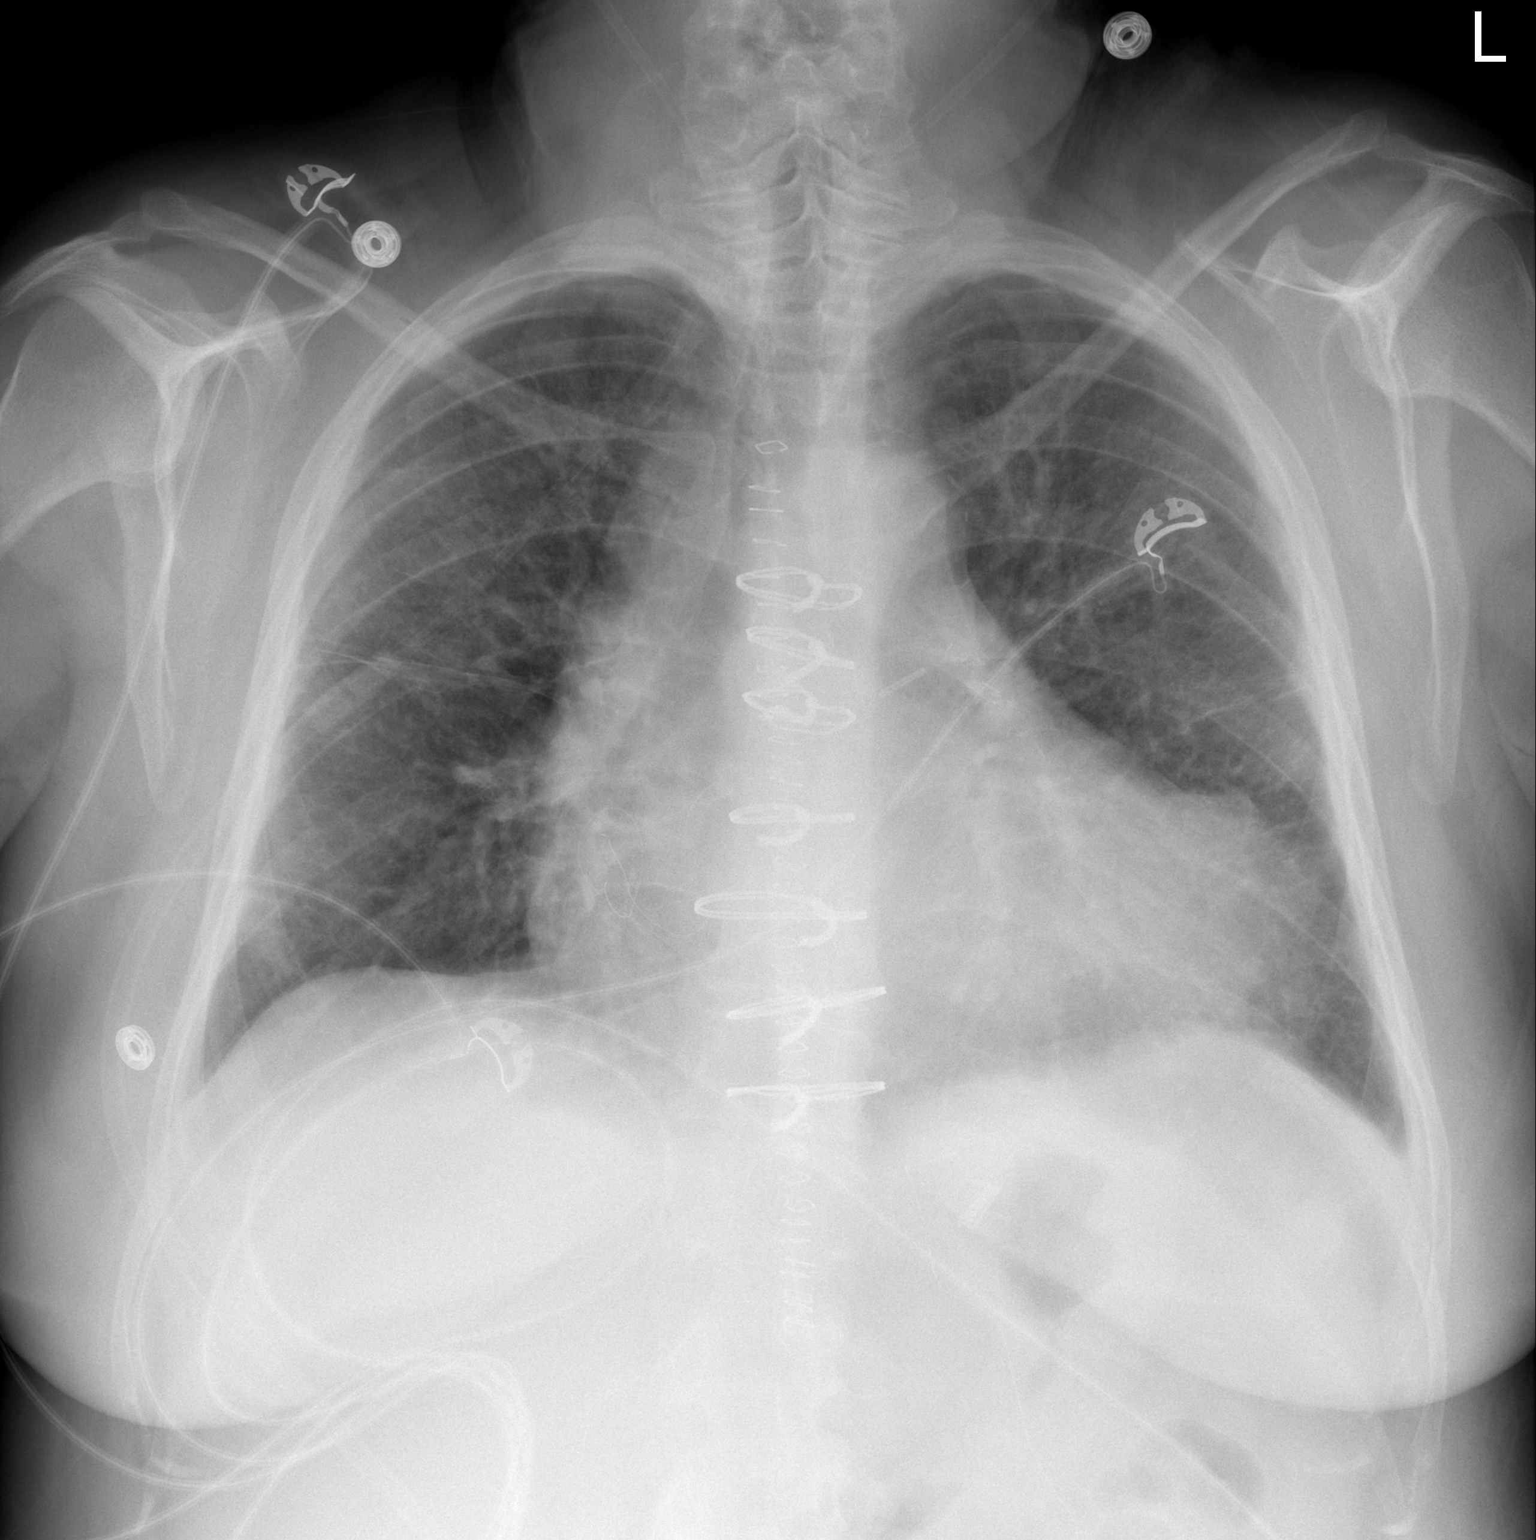

[w chest lat]
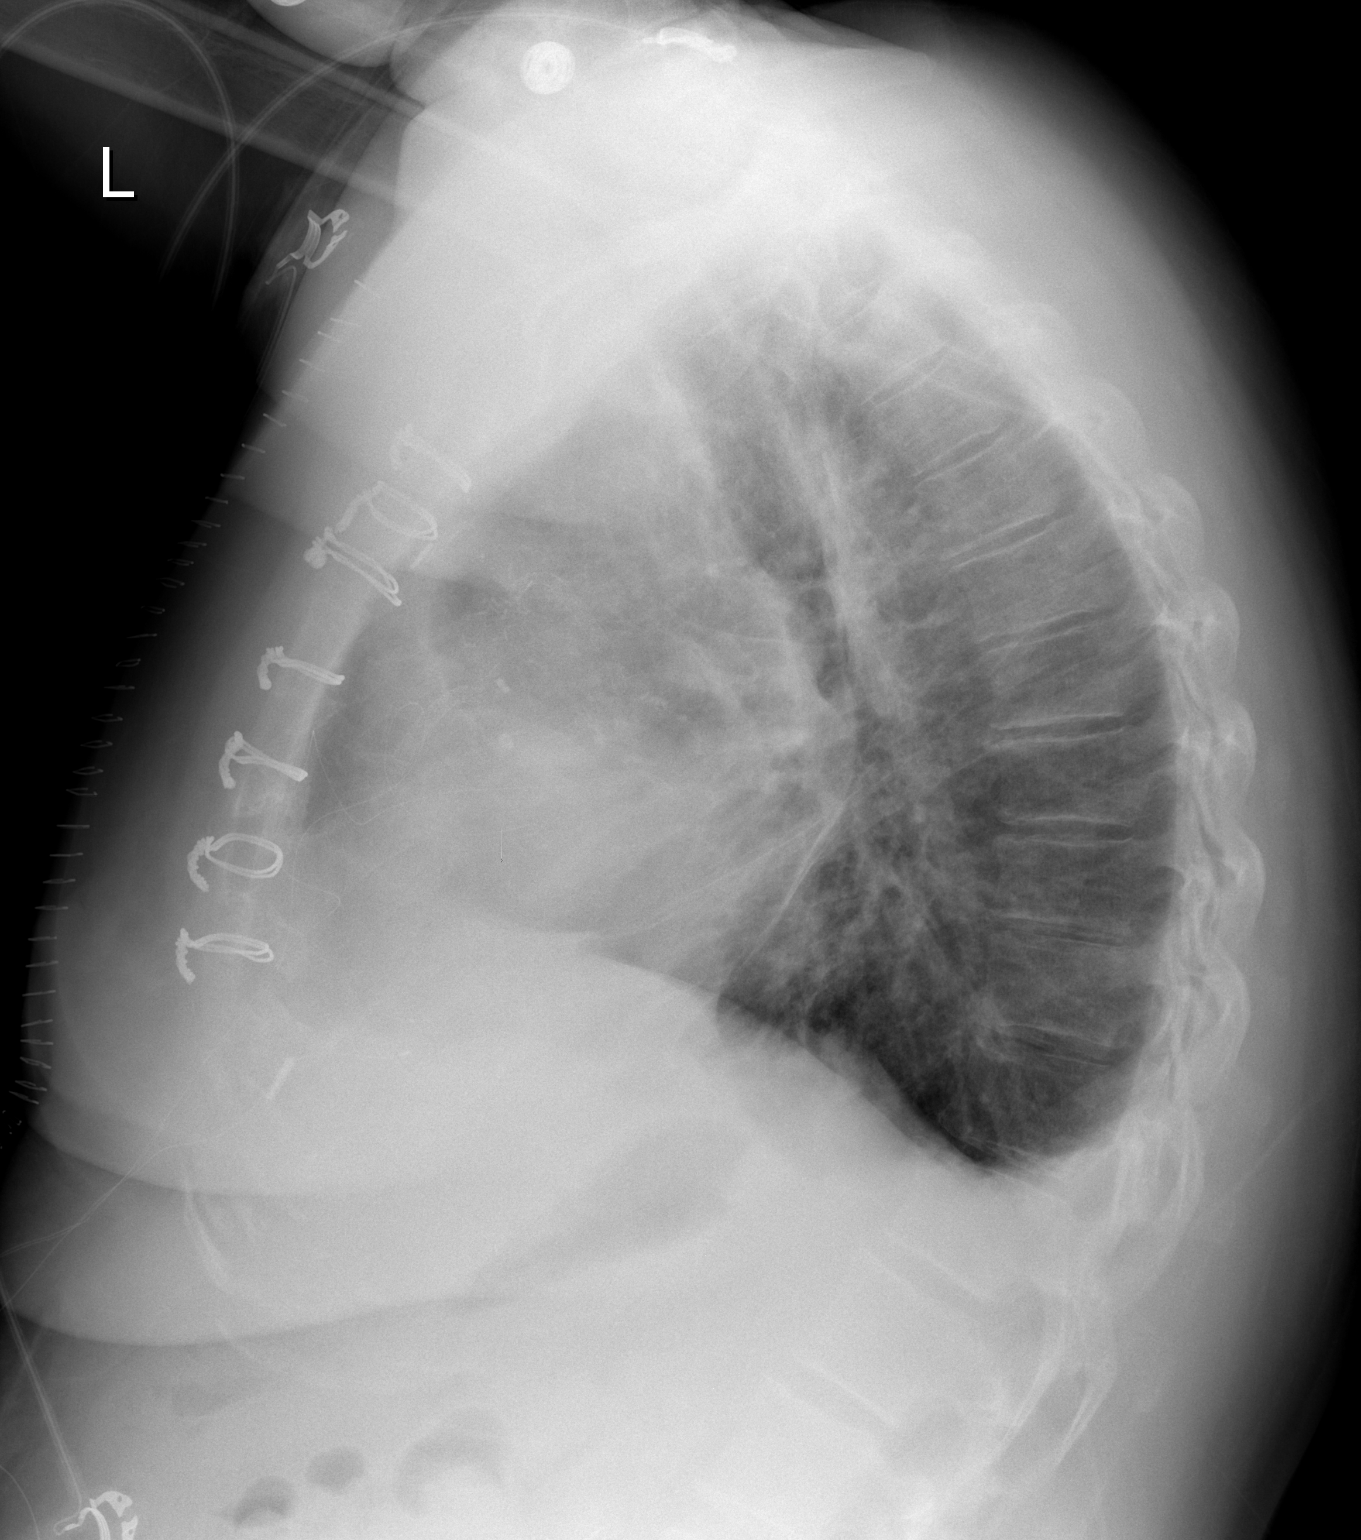

[2 of 2 positions shown; findings below may reference images not displayed]

FINDINGS: Aeration significantly improved.  No significant residual
edema.  Tiny bilateral pleural effusions present.  No evidence of
pneumothorax.  No significant  atelectasis.  Stable cardiomegaly.
IMPRESSION: Significant improved aeration.  No pneumothorax.  Tiny bilateral
pleural effusions.

## 2011-12-05 IMAGING — CR DG CHEST 2V
2 series · 2 of 2 positions shown · non-contrast
Comparison: 10/28/2010.

CLINICAL DATA: Cough, chest pain, heart surgery.

CHEST - 2 VIEW

[view not recorded (1 of 2)]
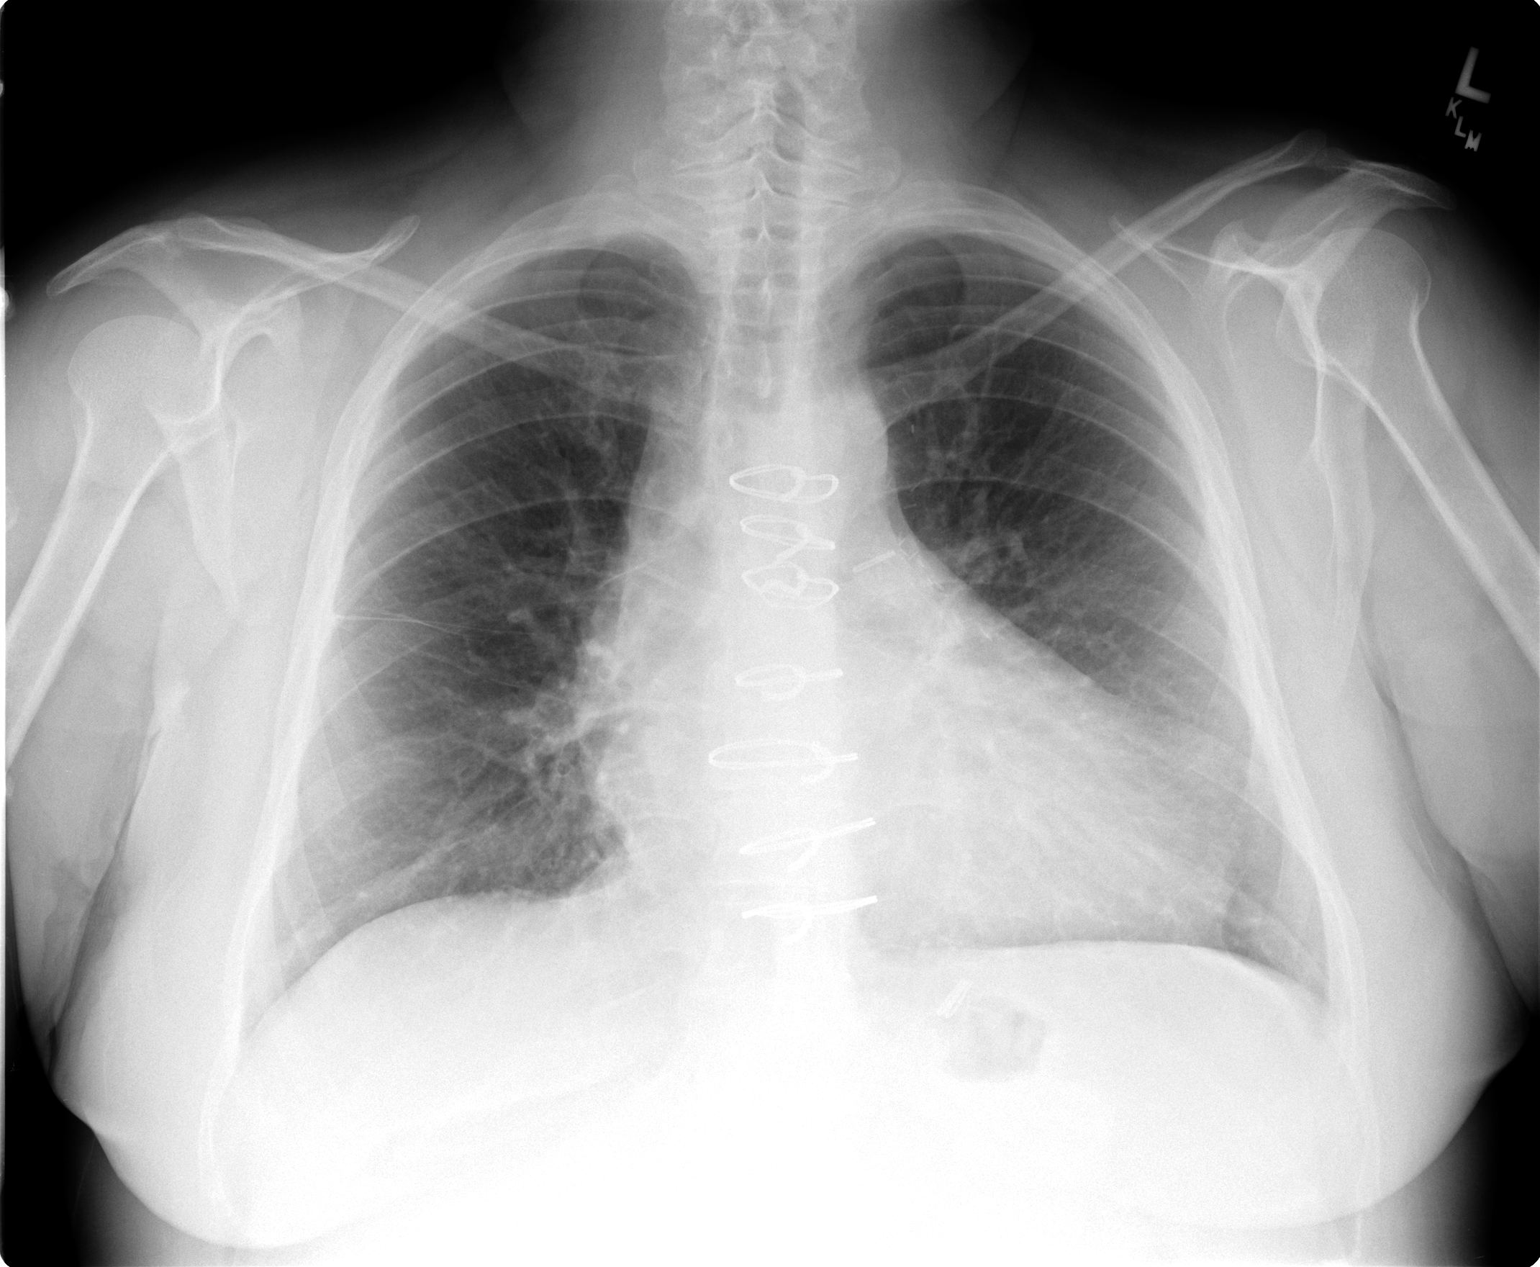

[view not recorded (2 of 2)]
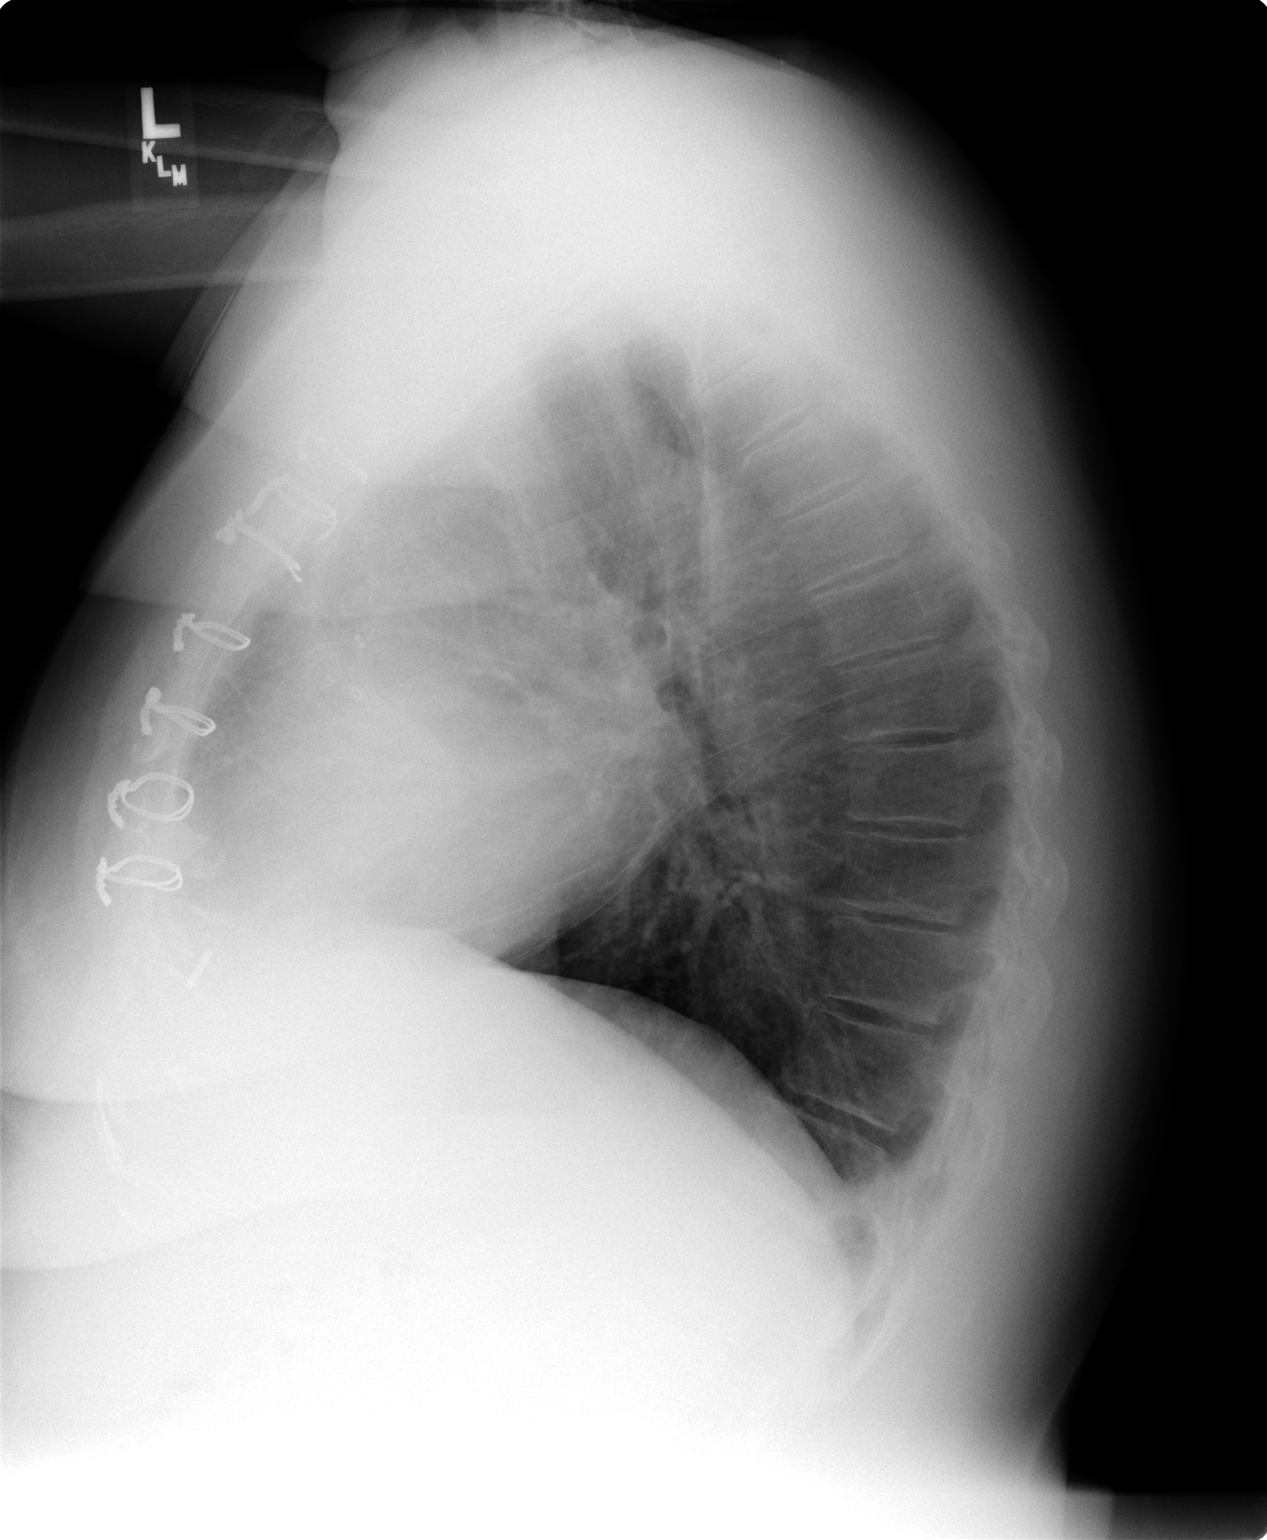

[2 of 2 positions shown; findings below may reference images not displayed]

FINDINGS: Trachea is midline.  Heart size stable.  Sternotomy wires
are unchanged in position.  No edema.  Chronic thickening of the
minor fissure.  Trace left pleural effusion.
IMPRESSION: Trace left pleural effusion.

## 2012-01-04 ENCOUNTER — Telehealth: Payer: Self-pay | Admitting: Cardiovascular Disease

## 2012-01-04 NOTE — Telephone Encounter (Signed)
Please return call to patient at hm# [phone]  Patient c/o swelling all of over body, patient has been taking fluid pills and hands have been going numb.  Patient is concerned as she had previous blockages in legs.  Please return call to patient at hm# [phone].

## 2012-01-04 NOTE — Telephone Encounter (Signed)
Pt wants to re establish in [place] office, told her to go to er or walk in clinic if she feels she needs to be seen as it is 5 pm and office is closed. Told her to call here Monday and ask to be set up with one of our physicians that go to [place], pt agreed to plan.

## 2012-01-14 ENCOUNTER — Other Ambulatory Visit: Payer: Self-pay | Admitting: *Deleted

## 2012-01-14 MED ORDER — LISINOPRIL 20 MG PO TABS: 20.0000 mg | ORAL_TABLET | Freq: Every day | ORAL | Status: DC

## 2012-01-15 ENCOUNTER — Other Ambulatory Visit: Payer: Self-pay

## 2012-01-15 MED ORDER — LISINOPRIL 20 MG PO TABS: 20.0000 mg | ORAL_TABLET | Freq: Every day | ORAL | Status: DC

## 2012-01-15 NOTE — Telephone Encounter (Signed)
..   Requested Prescriptions   Signed Prescriptions Disp Refills  . lisinopril (PRINIVIL,ZESTRIL) 20 MG tablet 30 tablet 6    Sig: Take 1 tablet (20 mg total) by mouth daily.    Authorizing Provider: Lewayne Bunting    Ordering User: Lacie Scotts

## 2012-02-11 ENCOUNTER — Other Ambulatory Visit: Payer: Self-pay | Admitting: *Deleted

## 2012-02-11 MED ORDER — FUROSEMIDE 40 MG PO TABS: 40.0000 mg | ORAL_TABLET | Freq: Every day | ORAL | Status: AC

## 2012-04-04 ENCOUNTER — Telehealth: Payer: Self-pay | Admitting: Cardiovascular Disease

## 2012-04-04 NOTE — Telephone Encounter (Signed)
New Problem:    Patient's fiance called because he is concerned because the patient's PCP is trying to take her off of her furosemide (LASIX) 40 MG tablet, and she is very swollen.  Also had a few questions regarding her other medications.  Please call back.

## 2012-04-04 NOTE — Telephone Encounter (Signed)
Fiance very upset that Dr Kirke Corin is not seeing pt's in [place] anymore, PCP is wanting to stop lasix / k+ and per fiance " they said the meds combined will kill her"?, pt unsure why they are stopping since she still has swelling in her hands? Fiance became increasingly agitated when I explained that without ov / labs- prescribing cant be done over the phone, I offered app in [place] and [place] and he stated it was too far and were unable to get to either places, I suggested establishing with Cardiologist closer to them in [place] and he stated that there weren't any their they would want to see. I explained there is nothing I can do over the phone without current labs/ pt has not been seen in one year, I explained that I will pass on msg of the frustration he is experiencing to Dr Kirke Corin, he hung up on me.

## 2012-04-04 NOTE — Telephone Encounter (Signed)
Thanks for handling this. She will have to be seen in order to make changes.

## 2012-04-08 ENCOUNTER — Other Ambulatory Visit: Payer: Self-pay

## 2012-07-16 ENCOUNTER — Other Ambulatory Visit: Payer: Self-pay | Admitting: Cardiology

## 2012-10-23 ENCOUNTER — Other Ambulatory Visit: Payer: Self-pay | Admitting: Cardiology

## 2012-12-20 ENCOUNTER — Other Ambulatory Visit: Payer: Self-pay | Admitting: Cardiology

## 2013-07-27 ENCOUNTER — Encounter: Payer: Medicaid Other | Admitting: Cardiology

## 2013-07-27 NOTE — Progress Notes (Signed)
HPI: followup coronary artery disease. Previously followed by Dr. Kirke Corin. She has had previous aortobifemoral bypass surgery in 2006. Patient is status post coronary artery bypassing graft in 2006. She had redo surgery in 2012. Echocardiogram in January of 2012 showed normal LV function. Carotid Dopplers in January 2012 showed 60-79% left and 40-59% right stenosis. Followup recommended in 6 months.    Current Outpatient Prescriptions  Medication Sig Dispense Refill  . aspirin 81 MG tablet Take 81 mg by mouth daily.        Marland Kitchen atorvastatin (LIPITOR) 40 MG tablet Take 1 tablet (40 mg total) by mouth daily.  30 tablet  6  . clopidogrel (PLAVIX) 75 MG tablet TABLET BY MOUTH EVERY DAY  30 tablet  4  . furosemide (LASIX) 40 MG tablet Take 1 tablet (40 mg total) by mouth daily.  30 tablet  0  . gabapentin (NEURONTIN) 600 MG tablet Take 1 tablet (600 mg total) by mouth 3 (three) times daily.  90 tablet  0  . insulin glargine (LANTUS) 100 UNIT/ML injection Inject 24 Units into the skin 2 (two) times daily.  10 mL  0  . lisinopril (PRINIVIL,ZESTRIL) 20 MG tablet TAKE 1 TABLET BY MOUTH EVERY DAY  30 tablet  7  . metoprolol (TOPROL XL) 100 MG 24 hr tablet Take 1 tablet (100 mg total) by mouth daily.  30 tablet  6  . nitroGLYCERIN (NITROLINGUAL) 0.4 MG/SPRAY spray Place 1 spray under the tongue every 5 (five) minutes as needed.        Marland Kitchen oxyCODONE (OXYCONTIN) 10 MG 12 hr tablet Take 5 mg by mouth every 12 (twelve) hours.        . potassium chloride (KLOR-CON) 10 MEQ CR tablet Take 2 tablets (20 mEq total) by mouth daily.  60 tablet  6  . zolpidem (AMBIEN) 10 MG tablet Take 1 tablet (10 mg total) by mouth at bedtime as needed.  30 tablet  0   No current facility-administered medications for this visit.     Past Medical History  Diagnosis Date  . Coronary artery disease 2006    sp CABG in 2006  . Peripheral arterial disease 2006    s/p aortobifemoral bypass surgery in 2006  . Diabetes mellitus     . Hypertension   . Obesity   . Ulcer of esophagus with bleeding   . Tobacco use disorder, continuous   . MI, acute, non ST segment elevation Jan. 2012  . Hyperlipidemia   . Hiatal hernia   . SOB (shortness of breath)   . Problem with medical care compliance   . Carotid artery occlusion     bilateral moderate carotid stenosis  . GERD (gastroesophageal reflux disease)   . Anemia     Past Surgical History  Procedure Laterality Date  . Cardiac catheterization  Jan.2012    occluded native coronary arteries. Patent LIMA to LAD. Occluded SVG to RCA,. SVG to OM: 90%distal ISR  . Coronary angioplasty with stent placement      Multiple PCI on SVGs before redo.   . Coronary artery bypass graft  2006, 2012    Redo: SVG to OM and SVG to RPDA (very poor target, likely nonfunctional). Not a redo candidate  . Hip surgery    . Hernia repair    . Abdominal hysterectomy      History   Social History  . Marital Status: Single    Spouse Name: N/A    Number of  Children: N/A  . Years of Education: N/A   Occupational History  . Not on file.   Social History Main Topics  . Smoking status: Former Smoker -- 1.50 packs/day for 12 years    Types: Cigarettes    Quit date: 11/08/2010  . Smokeless tobacco: Not on file  . Alcohol Use: No  . Drug Use: No  . Sexual Activity: Not on file   Other Topics Concern  . Not on file   Social History Narrative  . No narrative on file    ROS: no fevers or chills, productive cough, hemoptysis, dysphasia, odynophagia, melena, hematochezia, dysuria, hematuria, rash, seizure activity, orthopnea, PND, pedal edema, claudication. Remaining systems are negative.  Physical Exam: Well-developed well-nourished in no acute distress.  Skin is warm and dry.  HEENT is normal.  Neck is supple.  Chest is clear to auscultation with normal expansion.  Cardiovascular exam is regular rate and rhythm.  Abdominal exam nontender or distended. No masses  palpated. Extremities show no edema. neuro grossly intact  ECG     This encounter was created in error - please disregard.

## 2013-08-05 ENCOUNTER — Encounter: Payer: Self-pay | Admitting: Cardiology

## 2014-10-15 DIAGNOSIS — T79A29A Traumatic compartment syndrome of unspecified lower extremity, initial encounter: Secondary | ICD-10-CM

## 2014-10-15 HISTORY — PX: OTHER SURGICAL HISTORY: SHX169

## 2014-10-15 HISTORY — DX: Traumatic compartment syndrome of unspecified lower extremity, initial encounter: T79.A29A

## 2016-08-20 ENCOUNTER — Inpatient Hospital Stay (HOSPITAL_COMMUNITY)
Admission: AD | Admit: 2016-08-20 | Discharge: 2016-08-23 | DRG: 247 | Disposition: A | Discharge status: Home / Self Care | Payer: Medicaid Other | Source: Other Acute Inpatient Hospital | Attending: Cardiology | Admitting: Cardiology

## 2016-08-20 ENCOUNTER — Encounter (HOSPITAL_COMMUNITY): Payer: Self-pay | Admitting: Cardiology

## 2016-08-20 DIAGNOSIS — K219 Gastro-esophageal reflux disease without esophagitis: Secondary | ICD-10-CM | POA: Diagnosis present

## 2016-08-20 DIAGNOSIS — I252 Old myocardial infarction: Secondary | ICD-10-CM

## 2016-08-20 DIAGNOSIS — D638 Anemia in other chronic diseases classified elsewhere: Secondary | ICD-10-CM | POA: Diagnosis present

## 2016-08-20 DIAGNOSIS — Z955 Presence of coronary angioplasty implant and graft: Secondary | ICD-10-CM

## 2016-08-20 DIAGNOSIS — Z7902 Long term (current) use of antithrombotics/antiplatelets: Secondary | ICD-10-CM

## 2016-08-20 DIAGNOSIS — Z87891 Personal history of nicotine dependence: Secondary | ICD-10-CM

## 2016-08-20 DIAGNOSIS — Z6841 Body Mass Index (BMI) 40.0 and over, adult: Secondary | ICD-10-CM | POA: Diagnosis not present

## 2016-08-20 DIAGNOSIS — Z79899 Other long term (current) drug therapy: Secondary | ICD-10-CM | POA: Diagnosis not present

## 2016-08-20 DIAGNOSIS — Z95828 Presence of other vascular implants and grafts: Secondary | ICD-10-CM

## 2016-08-20 DIAGNOSIS — E1159 Type 2 diabetes mellitus with other circulatory complications: Secondary | ICD-10-CM | POA: Diagnosis present

## 2016-08-20 DIAGNOSIS — K760 Fatty (change of) liver, not elsewhere classified: Secondary | ICD-10-CM | POA: Diagnosis present

## 2016-08-20 DIAGNOSIS — I25119 Atherosclerotic heart disease of native coronary artery with unspecified angina pectoris: Secondary | ICD-10-CM | POA: Diagnosis present

## 2016-08-20 DIAGNOSIS — E782 Mixed hyperlipidemia: Secondary | ICD-10-CM

## 2016-08-20 DIAGNOSIS — Z880 Allergy status to penicillin: Secondary | ICD-10-CM | POA: Diagnosis not present

## 2016-08-20 DIAGNOSIS — Z95818 Presence of other cardiac implants and grafts: Secondary | ICD-10-CM | POA: Diagnosis not present

## 2016-08-20 DIAGNOSIS — E669 Obesity, unspecified: Secondary | ICD-10-CM | POA: Diagnosis present

## 2016-08-20 DIAGNOSIS — I4581 Long QT syndrome: Secondary | ICD-10-CM | POA: Diagnosis present

## 2016-08-20 DIAGNOSIS — Z885 Allergy status to narcotic agent status: Secondary | ICD-10-CM

## 2016-08-20 DIAGNOSIS — Z7982 Long term (current) use of aspirin: Secondary | ICD-10-CM | POA: Diagnosis not present

## 2016-08-20 DIAGNOSIS — I10 Essential (primary) hypertension: Secondary | ICD-10-CM | POA: Diagnosis present

## 2016-08-20 DIAGNOSIS — Z794 Long term (current) use of insulin: Secondary | ICD-10-CM

## 2016-08-20 DIAGNOSIS — Z823 Family history of stroke: Secondary | ICD-10-CM

## 2016-08-20 DIAGNOSIS — R1084 Generalized abdominal pain: Secondary | ICD-10-CM

## 2016-08-20 DIAGNOSIS — I25810 Atherosclerosis of coronary artery bypass graft(s) without angina pectoris: Secondary | ICD-10-CM | POA: Diagnosis not present

## 2016-08-20 DIAGNOSIS — I739 Peripheral vascular disease, unspecified: Secondary | ICD-10-CM

## 2016-08-20 DIAGNOSIS — I214 Non-ST elevation (NSTEMI) myocardial infarction: Principal | ICD-10-CM | POA: Diagnosis present

## 2016-08-20 DIAGNOSIS — I25700 Atherosclerosis of coronary artery bypass graft(s), unspecified, with unstable angina pectoris: Secondary | ICD-10-CM

## 2016-08-20 DIAGNOSIS — R079 Chest pain, unspecified: Secondary | ICD-10-CM | POA: Diagnosis not present

## 2016-08-20 DIAGNOSIS — Z9114 Patient's other noncompliance with medication regimen: Secondary | ICD-10-CM | POA: Diagnosis not present

## 2016-08-20 DIAGNOSIS — F17201 Nicotine dependence, unspecified, in remission: Secondary | ICD-10-CM

## 2016-08-20 DIAGNOSIS — Z8249 Family history of ischemic heart disease and other diseases of the circulatory system: Secondary | ICD-10-CM

## 2016-08-20 DIAGNOSIS — I25110 Atherosclerotic heart disease of native coronary artery with unstable angina pectoris: Secondary | ICD-10-CM | POA: Diagnosis not present

## 2016-08-20 DIAGNOSIS — Z951 Presence of aortocoronary bypass graft: Secondary | ICD-10-CM

## 2016-08-20 DIAGNOSIS — E1151 Type 2 diabetes mellitus with diabetic peripheral angiopathy without gangrene: Secondary | ICD-10-CM | POA: Diagnosis present

## 2016-08-20 DIAGNOSIS — E785 Hyperlipidemia, unspecified: Secondary | ICD-10-CM | POA: Diagnosis present

## 2016-08-20 HISTORY — DX: Essential (primary) hypertension: I10

## 2016-08-20 HISTORY — DX: Type 2 diabetes mellitus without complications: E11.9

## 2016-08-20 HISTORY — DX: Traumatic compartment syndrome of unspecified lower extremity, initial encounter: T79.A29A

## 2016-08-20 LAB — CBC
HEMATOCRIT: 28.6 % — AB (ref 36.0–46.0)
HEMOGLOBIN: 8.9 g/dL — AB (ref 12.0–15.0)
MCH: 28.5 pg (ref 26.0–34.0)
MCHC: 31.1 g/dL (ref 30.0–36.0)
MCV: 91.7 fL (ref 78.0–100.0)
Platelets: 208 10*3/uL (ref 150–400)
RBC: 3.12 MIL/uL — ABNORMAL LOW (ref 3.87–5.11)
RDW: 15.1 % (ref 11.5–15.5)
WBC: 6.6 10*3/uL (ref 4.0–10.5)

## 2016-08-20 LAB — HEPARIN LEVEL (UNFRACTIONATED): HEPARIN UNFRACTIONATED: 0.13 [IU]/mL — AB (ref 0.30–0.70)

## 2016-08-20 LAB — GLUCOSE, CAPILLARY: Glucose-Capillary: 122 mg/dL — ABNORMAL HIGH (ref 65–99)

## 2016-08-20 LAB — MRSA PCR SCREENING: MRSA by PCR: NEGATIVE

## 2016-08-20 MED ORDER — CLONAZEPAM 1 MG PO TABS
1.0000 mg | ORAL_TABLET | Freq: Two times a day (BID) | ORAL | Status: DC | PRN
  Administered 2016-08-20 – 2016-08-23 (×4): 1 mg via ORAL
  Filled 2016-08-20 (×4): qty 1

## 2016-08-20 MED ORDER — ZOLPIDEM TARTRATE 5 MG PO TABS
5.0000 mg | ORAL_TABLET | Freq: Every evening | ORAL | Status: DC | PRN
  Administered 2016-08-21: 5 mg via ORAL
  Filled 2016-08-20 (×2): qty 1

## 2016-08-20 MED ORDER — SODIUM CHLORIDE 0.9 % IV SOLN: 250.0000 mL | INTRAVENOUS | Status: DC | PRN

## 2016-08-20 MED ORDER — HEPARIN (PORCINE) IN NACL 100-0.45 UNIT/ML-% IJ SOLN
1900.0000 [IU]/h | INTRAMUSCULAR | Status: DC
  Administered 2016-08-21 (×2): 1700 [IU]/h via INTRAVENOUS
  Administered 2016-08-22: 1900 [IU]/h via INTRAVENOUS
  Filled 2016-08-20 (×2): qty 250

## 2016-08-20 MED ORDER — FUROSEMIDE 40 MG PO TABS: 40.0000 mg | ORAL_TABLET | Freq: Every day | ORAL | Status: DC

## 2016-08-20 MED ORDER — SODIUM CHLORIDE 0.9 % WEIGHT BASED INFUSION
1.0000 mL/kg/h | INTRAVENOUS | Status: DC
  Administered 2016-08-21 (×2): 1 mL/kg/h via INTRAVENOUS

## 2016-08-20 MED ORDER — NITROGLYCERIN 0.4 MG SL SUBL: 0.4000 mg | SUBLINGUAL_TABLET | SUBLINGUAL | Status: DC | PRN

## 2016-08-20 MED ORDER — GABAPENTIN 600 MG PO TABS
600.0000 mg | ORAL_TABLET | Freq: Three times a day (TID) | ORAL | Status: DC
  Administered 2016-08-21 – 2016-08-23 (×5): 600 mg via ORAL
  Filled 2016-08-20 (×6): qty 1

## 2016-08-20 MED ORDER — METOPROLOL SUCCINATE ER 25 MG PO TB24
25.0000 mg | ORAL_TABLET | Freq: Every day | ORAL | Status: DC
  Administered 2016-08-21 – 2016-08-23 (×2): 25 mg via ORAL
  Filled 2016-08-20 (×2): qty 1

## 2016-08-20 MED ORDER — ASPIRIN EC 81 MG PO TBEC
81.0000 mg | DELAYED_RELEASE_TABLET | Freq: Every day | ORAL | Status: DC
  Administered 2016-08-21 – 2016-08-23 (×4): 81 mg via ORAL
  Filled 2016-08-20 (×3): qty 1

## 2016-08-20 MED ORDER — ATORVASTATIN CALCIUM 80 MG PO TABS
80.0000 mg | ORAL_TABLET | Freq: Every day | ORAL | Status: DC
  Administered 2016-08-21 – 2016-08-22 (×2): 80 mg via ORAL
  Filled 2016-08-20 (×2): qty 1

## 2016-08-20 MED ORDER — CLOPIDOGREL BISULFATE 75 MG PO TABS
75.0000 mg | ORAL_TABLET | Freq: Every day | ORAL | Status: DC
  Administered 2016-08-21 – 2016-08-23 (×4): 75 mg via ORAL
  Filled 2016-08-20 (×3): qty 1

## 2016-08-20 MED ORDER — HEPARIN BOLUS VIA INFUSION
3000.0000 [IU] | Freq: Once | INTRAVENOUS | Status: AC
  Administered 2016-08-21: 3000 [IU] via INTRAVENOUS
  Filled 2016-08-20: qty 3000

## 2016-08-20 MED ORDER — ONDANSETRON HCL 4 MG/2ML IJ SOLN: 4.0000 mg | Freq: Four times a day (QID) | INTRAMUSCULAR | Status: DC | PRN

## 2016-08-20 MED ORDER — SODIUM CHLORIDE 0.9 % WEIGHT BASED INFUSION
3.0000 mL/kg/h | INTRAVENOUS | Status: AC
  Administered 2016-08-21: 3 mL/kg/h via INTRAVENOUS

## 2016-08-20 MED ORDER — SODIUM CHLORIDE 0.9% FLUSH
3.0000 mL | Freq: Two times a day (BID) | INTRAVENOUS | Status: DC
  Administered 2016-08-20 – 2016-08-21 (×3): 3 mL via INTRAVENOUS

## 2016-08-20 MED ORDER — ACETAMINOPHEN 325 MG PO TABS
650.0000 mg | ORAL_TABLET | ORAL | Status: DC | PRN
  Administered 2016-08-22: 650 mg via ORAL
  Filled 2016-08-20: qty 2

## 2016-08-20 MED ORDER — OXYCODONE HCL 10 MG PO TB12: 5.0000 mg | ORAL_TABLET | Freq: Two times a day (BID) | ORAL | Status: DC

## 2016-08-20 MED ORDER — OXYCODONE HCL 5 MG PO TABS
15.0000 mg | ORAL_TABLET | Freq: Three times a day (TID) | ORAL | Status: DC | PRN
  Administered 2016-08-20 – 2016-08-23 (×5): 15 mg via ORAL
  Filled 2016-08-20 (×6): qty 3

## 2016-08-20 MED ORDER — ASPIRIN 81 MG PO CHEW
81.0000 mg | CHEWABLE_TABLET | ORAL | Status: AC
  Administered 2016-08-21: 81 mg via ORAL
  Filled 2016-08-20: qty 1

## 2016-08-20 MED ORDER — POTASSIUM CHLORIDE CRYS ER 20 MEQ PO TBCR
20.0000 meq | EXTENDED_RELEASE_TABLET | Freq: Every day | ORAL | Status: DC
  Administered 2016-08-23: 20 meq via ORAL
  Filled 2016-08-20: qty 1

## 2016-08-20 MED ORDER — HEPARIN BOLUS VIA INFUSION
4000.0000 [IU] | Freq: Once | INTRAVENOUS | Status: DC
  Filled 2016-08-20: qty 4000

## 2016-08-20 MED ORDER — INSULIN GLARGINE 100 UNIT/ML ~~LOC~~ SOLN
24.0000 [IU] | Freq: Two times a day (BID) | SUBCUTANEOUS | Status: DC
  Administered 2016-08-20 – 2016-08-23 (×4): 24 [IU] via SUBCUTANEOUS
  Filled 2016-08-20 (×8): qty 0.24

## 2016-08-20 MED ORDER — SODIUM CHLORIDE 0.9% FLUSH: 3.0000 mL | INTRAVENOUS | Status: DC | PRN

## 2016-08-20 MED ORDER — HEPARIN (PORCINE) IN NACL 100-0.45 UNIT/ML-% IJ SOLN: 850.0000 [IU]/h | INTRAMUSCULAR | Status: DC

## 2016-08-20 NOTE — Progress Notes (Signed)
ANTICOAGULATION CONSULT NOTE - Follow Up Consult  Pharmacy Consult for heparin Indication: chest pain/ACS  Labs:  Recent Labs  08/20/16 2214  HGB 8.9*  HCT 28.6*  PLT 208  HEPARINUNFRC 0.13*    Assessment: 40yo female subtherapeutic on heparin with initial dosing for CP.  Goal of Therapy:  Heparin level 0.3-0.7 units/ml   Plan:  Will rebolus with heparin 3000 units and increase gtt by 3 units/kg/hr to 1700 units/hr and check level in 6hr.  Vernard GamblesVeronda Weslee Prestage, PharmD, BCPS  08/20/2016,11:50 PM

## 2016-08-20 NOTE — Progress Notes (Signed)
Md notified of pt's request for pain medication..  Order was d/c by pharmacy .  Pt states she takes 15 mg oxycodone tid.  Also Klonopin 1mg  bid.  New orders received.  Will continue to monitor. Karena Addisonoro, Tabby Beaston T

## 2016-08-20 NOTE — Progress Notes (Deleted)
ANTICOAGULATION CONSULT NOTE - Initial Consult  Pharmacy Consult for heparin Indication: chest pain/ACS  Allergies  Allergen Reactions  . Penicillins    Patient Measurements: Height: 5\' 1"  (154.9 cm) Weight: 217 lb 9.5 oz (98.7 kg) IBW/kg (Calculated) : 47.8 Heparin Dosing Weight: 71.4 kg  Vital Signs: Temp Source: Oral (11/06 1933) BP: 137/74 (11/06 2030) Pulse Rate: 80 (11/06 2030)  Labs: No results for input(s): HGB, HCT, PLT, APTT, LABPROT, INR, HEPARINUNFRC, HEPRLOWMOCWT, CREATININE, CKTOTAL, CKMB, TROPONINI in the last 72 hours.  CrCl cannot be calculated (Patient's most recent lab result is older than the maximum 21 days allowed.).  Medical History: Past Medical History:  Diagnosis Date  . Anemia   . Carotid artery occlusion    Bilateral moderate carotid stenosis  . Compartment syndrome of lower extremity (HCC) 2016   In setting PAD [facility]  . Coronary artery disease    Multivessel s/p CABG in 2006 with redo 2012  . Essential hypertension   . GERD (gastroesophageal reflux disease)   . Hiatal hernia   . Hyperlipidemia   . MI, acute, non ST segment elevation [facility](HCC) Jan. 2012  . Obesity   . Peripheral arterial disease (HCC) 2006   Aortobifemoral bypass surgery in 2006 with redo 2016 [facility])  . Problem with medical care compliance   . Type 2 diabetes mellitus (HCC)   . Ulcer of esophagus with bleeding    Medications:  Prescriptions Prior to Admission  Medication Sig Dispense Refill Last Dose  . aspirin 81 MG tablet Take 81 mg by mouth daily.     Taking  . atorvastatin (LIPITOR) 40 MG tablet Take 1 tablet (40 mg total) by mouth daily. 30 tablet 6   . clopidogrel (PLAVIX) 75 MG tablet TABLET BY MOUTH EVERY DAY 30 tablet 4   . furosemide (LASIX) 40 MG tablet Take 1 tablet (40 mg total) by mouth daily. 30 tablet 0   . gabapentin (NEURONTIN) 600 MG tablet Take 1 tablet (600 mg total) by mouth 3 (three) times daily.  90 tablet 0   . insulin glargine (LANTUS) 100 UNIT/ML injection Inject 24 Units into the skin 2 (two) times daily. 10 mL 0   . lisinopril (PRINIVIL,ZESTRIL) 20 MG tablet TAKE 1 TABLET BY MOUTH EVERY DAY 30 tablet 7   . metoprolol (TOPROL XL) 100 MG 24 hr tablet Take 1 tablet (100 mg total) by mouth daily. 30 tablet 6   . nitroGLYCERIN (NITROLINGUAL) 0.4 MG/SPRAY spray Place 1 spray under the tongue every 5 (five) minutes as needed.     Taking  . oxyCODONE (OXYCONTIN) 10 MG 12 hr tablet Take 5 mg by mouth every 12 (twelve) hours.     Not Taking  . potassium chloride (KLOR-CON) 10 MEQ CR tablet Take 2 tablets (20 mEq total) by mouth daily. 60 tablet 6   . zolpidem (AMBIEN) 10 MG tablet Take 1 tablet (10 mg total) by mouth at bedtime as needed. 30 tablet 0    Assessment: [name] is a 40 y.o.female [facility] with four day history of chest pain. Troponin levels in the 0.7-0.8 range at OSH and ECG showed sinus rhythm with old inferior infarct pattern and diffuse NSST changes, prolonged QT. Pharmacy has been asked to start heparin. No anticoagulation prior to admission. Baseline CBC pending.  Goal of Therapy:  Heparin level 0.3-0.7 units/ml Monitor platelets by anticoagulation protocol: Yes   Plan:  Give 4000 units bolus x 1 Start heparin  infusion at 850 units/hr Check anti-Xa level in 6 hours and daily while on heparin Continue to monitor H&H and platelets  Layton Naves L Alexica Schlossberg 08/20/2016,8:48 PM

## 2016-08-20 NOTE — Progress Notes (Signed)
ANTICOAGULATION CONSULT NOTE - Initial Consult  Pharmacy Consult for heparin Indication: chest pain/ACS  Allergies  Allergen Reactions  . Penicillins     Has patient had a PCN reaction causing immediate rash, facial/tongue/throat swelling, SOB or lightheadedness with hypotension: YES Has patient had a PCN reaction causing severe rash involving mucus membranes or skin necrosis: NO Has patient had a PCN reaction that required hospitalization NO Has patient had a PCN reaction occurring within the last 10 years: YES If all of the above answers are "NO", then may proceed with Cephalosporin use.  . Tramadol     Hives    Patient Measurements: Height: 5\' 1"  (154.9 cm) Weight: 217 lb 9.5 oz (98.7 kg) IBW/kg (Calculated) : 47.8 Heparin Dosing Weight: 71.4 kg  Vital Signs: Temp Source: Oral (11/06 1933) BP: 118/65 (11/06 2100) Pulse Rate: 78 (11/06 2100)  Labs: No results for input(s): HGB, HCT, PLT, APTT, LABPROT, INR, HEPARINUNFRC, HEPRLOWMOCWT, CREATININE, CKTOTAL, CKMB, TROPONINI in the last 72 hours.  CrCl cannot be calculated (Patient's most recent lab result is older than the maximum 21 days allowed.).  Medical History: Past Medical History:  Diagnosis Date  . Anemia   . Carotid artery occlusion    Bilateral moderate carotid stenosis  . Compartment syndrome of lower extremity (HCC) 2016   In setting PAD with thrombectomy and [facility]  . Coronary artery disease    Multivessel s/p CABG in 2006 with redo 2012  . Essential hypertension   . GERD (gastroesophageal reflux disease)   . Hiatal hernia   . Hyperlipidemia   . MI, acute, non ST segment elevation [employer](HCC) Jan. 2012  . Obesity   . Peripheral arterial disease (HCC) 2006   Aortobifemoral bypass surgery in 2006 with redo 2016 [facility]  . Problem with medical care compliance   . Type 2 diabetes mellitus (HCC)   . Ulcer of esophagus with bleeding    Medications:  Prescriptions Prior to  Admission  Medication Sig Dispense Refill Last Dose  . aspirin 81 MG tablet Take 81 mg by mouth daily.     08/20/2016 at Unknown time  . Cholecalciferol (VITAMIN D3) 50000 units CAPS Take 1 tablet by mouth once a week. Take on Saturdays   Past Week at Unknown time  . clopidogrel (PLAVIX) 75 MG tablet TABLET BY MOUTH EVERY DAY 30 tablet 4 08/20/2016 at Unknown time  . furosemide (LASIX) 40 MG tablet Take 1 tablet (40 mg total) by mouth daily. (Patient taking differently: Take 40 mg by mouth 2 (two) times daily. ) 30 tablet 0 08/20/2016 at Unknown time  . gabapentin (NEURONTIN) 600 MG tablet Take 1 tablet (600 mg total) by mouth 3 (three) times daily. 90 tablet 0 Past Week at Unknown time  . insulin glargine (LANTUS) 100 UNIT/ML injection Inject 24 Units into the skin 2 (two) times daily. 10 mL 0 Past Week at Unknown time  . lisinopril (PRINIVIL,ZESTRIL) 20 MG tablet TAKE 1 TABLET BY MOUTH EVERY DAY 30 tablet 7 08/20/2016 at Unknown time  . nitroGLYCERIN (NITROLINGUAL) 0.4 MG/SPRAY spray Place 1 spray under the tongue every 5 (five) minutes as needed for chest pain.    unknown at unknown  . nitroGLYCERIN (NITROSTAT) 0.4 MG SL tablet Place 0.4 mg under the tongue every 5 (five) minutes as needed for chest pain. Given by EMS couple days ago. Patient states she has the nitro spray at home and it helps better for her   Past Week at Unknown time  .  oxyCODONE (ROXICODONE) 15 MG immediate release tablet Take 15 mg by mouth 3 (three) times daily.   Past Week at Unknown time  . potassium chloride (KLOR-CON) 10 MEQ CR tablet Take 2 tablets (20 mEq total) by mouth daily. 60 tablet 6 Past Week at Unknown time  . warfarin (COUMADIN) 7.5 MG tablet Take 7.5 mg by mouth every other day. Every other day is usually Monday Wednesday and fridays   Past Week at Unknown time  . zolpidem (AMBIEN) 10 MG tablet Take 1 tablet (10 mg total) by mouth at bedtime as needed. (Patient taking differently: Take 10 mg by mouth at bedtime as  needed for sleep. ) 30 tablet 0 Past Week at Unknown time  . atorvastatin (LIPITOR) 40 MG tablet Take 1 tablet (40 mg total) by mouth daily. 30 tablet 6   . metoprolol (TOPROL XL) 100 MG 24 hr tablet Take 1 tablet (100 mg total) by mouth daily. 30 tablet 6    Assessment: Ms. Margo AyeHall is a 40 y.o.female [facility] four day history of chest pain. Troponin levels in the 0.7-0.8 range at OSH and ECG showed sinus rhythm with old inferior infarct pattern and diffuse NSST changes, prolonged QT. Pharmacy has been asked to start heparin. No anticoagulation prior to admission. Baseline CBC pending.  Goal of Therapy:  Heparin level 0.3-0.7 units/ml Monitor platelets by anticoagulation protocol: Yes   Plan:  Give 4000 units bolus x 1 Start heparin infusion at 850 units/hr Check anti-Xa level in 6 hours and daily while on heparin Continue to monitor H&H and platelets  Alinda Moneyony L Zayed Griffie 08/20/2016,10:52 PM  __________________________________________________________________________ Addendum:  Patient was on heparin 1400 units/hour when transferred to our facility. Immediately discontinued my previous orders and STAT heparin level was ordered for this patient. Waiting for the lab results. Unable to delete previous note at this time as Karena AddisonJudy Coro has it locked trying to addend my note.

## 2016-08-20 NOTE — H&P (Signed)
Primary cardiologist: Dr. Lorine BearsMuhammad Arida (last seen 2012) Primary care: Torrie MayersStephanie Samuelson, NP [facility]([place])  Reason for admission: NSTEMI  Clinical Summary [name] is a 40 y.o.female [facility] with four day history of chest pain, waxing and waning, described as both sharp and tight at times, some responsiveness to nitroglycerine. She states that similar to prior angina. Troponin I levels in the 0.7-0.8 range at OSH and ECG showed sinus rhythm with old inferior infarct pattern and diffuse NSST changes, prolonged QT. She has also been experiencing abdominal pain, no melena or hematochezia, no emesis. Outside chest x-ray reports no pulmonary edema or effusions, stable thoracic compression deformity. Abdominal and pelvic CT imaging revealed markedly hepatomegaly consistent with hepatic steatosis, also evidence of previous ventral abdominal hernia repair with loops of large and small bowel noted within the hernia however no suggestion of compromise or obstruction, also moderate amount of stool in the large bowel.  Cardiac history is updated below. She has a history of premature multivessel CAD status post CABG in 2006 with redo operation in 2012. She states that she has not followed with cardiology since that time, [place] and reports transportation problems. 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 She states that she had a stress test last year - records do show a [facility] perfusion [facility] in April 2015 revealing a small area of ischemia in the basal anterior and anterolateral walls, soft tissue attenuation in the inferolateral wall.  She reports compliance with her medications as an outpatient. Also reports no tobacco use since 2012.  She has been placed on heparin infusion by the outside facility, does not complain of any chest pain following transfer, only mild abdominal discomfort.   Allergies    Allergen Reactions  . Penicillins     Home Medications Prescriptions Prior to Admission  Medication Sig Dispense Refill Last Dose  . aspirin 81 MG tablet Take 81 mg by mouth daily.     Taking  . atorvastatin (LIPITOR) 40 MG tablet Take 1 tablet (40 mg total) by mouth daily. 30 tablet 6   . clopidogrel (PLAVIX) 75 MG tablet TABLET BY MOUTH EVERY DAY 30 tablet 4   . furosemide (LASIX) 40 MG tablet Take 1 tablet (40 mg total) by mouth daily. 30 tablet 0   . gabapentin (NEURONTIN) 600 MG tablet Take 1 tablet (600 mg total) by mouth 3 (three) times daily. 90 tablet 0   . insulin glargine (LANTUS) 100 UNIT/ML injection Inject 24 Units into the skin 2 (two) times daily. 10 mL 0   . lisinopril (PRINIVIL,ZESTRIL) 20 MG tablet TAKE 1 TABLET BY MOUTH EVERY DAY 30 tablet 7   . metoprolol (TOPROL XL) 100 MG 24 hr tablet Take 1 tablet (100 mg total) by mouth daily. 30 tablet 6   . nitroGLYCERIN (NITROLINGUAL) 0.4 MG/SPRAY spray Place 1 spray under the tongue every 5 (five) minutes as needed.     Taking  . oxyCODONE (OXYCONTIN) 10 MG 12 hr tablet Take 5 mg by mouth every 12 (twelve) hours.     Not Taking  . potassium chloride (KLOR-CON) 10 MEQ CR tablet Take 2 tablets (20 mEq total) by mouth daily. 60 tablet 6   . zolpidem (AMBIEN) 10 MG tablet Take 1 tablet (10 mg total) by mouth at bedtime as needed. 30 tablet 0     Past Medical History:  Diagnosis Date  . Anemia   . Carotid artery occlusion  Bilateral moderate carotid stenosis  . Compartment syndrome of lower extremity (HCC) 2016   In setting PAD with thrombectomy and redo [facility]obifemoral bypass at [facility]  . Coronary artery disease    Multivessel s/p CABG in 2006 with redo 2012  . Essential hypertension   . GERD (gastroesophageal reflux disease)   . Hiatal hernia   . Hyperlipidemia   . MI, acute, non ST segment elevation [facility]) Jan. 2012  . Obesity   . Peripheral arterial disease (HCC) 2006   Aortobifemoral bypass surgery in 2006 with redo  2016 [facility])  . Problem with medical care compliance   . Type 2 diabetes mellitus (HCC)   . Ulcer of esophagus with bleeding     Past Surgical History:  Procedure Laterality Date  . ABDOMINAL HYSTERECTOMY    . Aortobifemoral bypass  2016   Redo procedure [facility]  . CARDIAC CATHETERIZATION  10/2010   Occluded native coronary arteries. Patent LIMA to LAD. Occluded SVG to RCA,. SVG to OM: 90%distal ISR  . CORONARY ANGIOPLASTY WITH STENT PLACEMENT     Multiple PCI on SVGs before redo.   . CORONARY ARTERY BYPASS GRAFT  2006, 2012   Redo: SVG to OM and SVG to RPDA (very poor target, likely nonfunctional). Not a redo candidate  . HERNIA REPAIR    . HIP SURGERY      Family History  Problem Relation Age of Onset  . Heart disease Mother   . Stroke Mother   . Heart disease Father     Social History Ms. Schier reports that she quit smoking about 5 years ago. Her smoking use included Cigarettes. She has a 18.00 pack-year smoking history. She has never used smokeless tobacco. Ms. Parsley reports that she does not drink alcohol.  Review of Systems Complete review of systems negative except as otherwise outlined in the clinical summary and also the following. Fatigue, mild leg pain.  Physical Examination Pulse Rate:  [81] 81 (11/06 1933) BP: (155)/(126) 155/126 (11/06 1933) SpO2:  [100 %] 100 % (11/06 1933) Weight:  [217 lb 9.5 oz (98.7 kg)] 217 lb 9.5 oz (98.7 kg) (11/06 1933)  Telemetry: Sinus rhythm.  Gen.: Short statured overweight woman in no distress. HEENT: Conjunctiva and lids normal, oropharynx clear with poor dentition. Neck: Supple, no elevated JVP or carotid bruits, no thyromegaly. Lungs: Clear to auscultation, nonlabored breathing at rest. Cardiac: Regular rate and rhythm, no S3 , soft systolic murmur, no pericardial rub. Abdomen: Soft, protuberant, bowel sounds present, no guarding or rebound. Extremities: Trace edema, distal pulses 1+ DPs. Normal right radial. Skin: Warm  and dry. Musculoskeletal: No kyphosis. Neuropsychiatric: Alert and oriented x3, affect grossly appropriate.   Lab Results  Labwork from outside hospital initially showed elevated lactic acid of 4.1, ultimately down to 1, WBC 7.2, hemoglobin 9.9, platelets 233, sodium 139, potassium 3.6, creatinine 0.9, AST 37, ALT 28, BUN 21, BNP 71  Impression  1. Presentation with prolonged, waxing and waning chest discomfort having both typical and atypical features. Patient states similar to previous angina. Troponin I levels are mildly elevated and in a relatively flat pattern between 0.7 and 0.8 at outside hospital. ECG abnormal consistent with old inferior infarct and fairly diffuse nonspecific ST-T changes. Concern is for ACS/NSTEMI, particularly in light of aggressive vascular disease history.  2. Premature multivessel CAD status post CABG in 2006 with redo operation in 2012. She has not had regular cardiology follow-up since that time. Myocardial perfusion study at [facility] in 2015 reported a small  region of ischemia in the basal anterior and anterolateral walls with inferolateral attenuation.  3. Peripheral arterial disease status post aortobifemoral bypass grafting in 2006 with red[facility]. She required thrombectomy at that time and also developed compartment syndrome that required surgical treatment.  4. Tobacco abuse in remission, patient denies smoking since 2012.  5. Chronic anemia, recent hemoglobin 9.9. Reports no melena or hematochezia.  6. Hyperlipidemia, on Lipitor as an outpatient.  7. Recent abdominal discomfort, etiology not clear as yet. CT imaging at outside hospital as outlined above. She does have a prior ventral hernia repair with small and large bowel loops present in hernia but no obvious compromise based on imaging study. LFTs are normal. Evidence of hepatic steatosis noted.  8. Essential hypertension.  Recommendations  Patient currently admitted to  stepdown. Plan to continue cardiac medical regimen with the addition of heparin, follow-up troponin I levels and lab work with repeat ECG. Echocardiogram is being ordered . Ultimately anticipate diagnostic cardiac catheterization to reassess coronary and bypass patency. Need to follow-up on lab work first, guaiac stools, and ensure no continued drop in hemoglobin or other acute abdominal symptoms that may require workup first. Will recheck lipids on Lipitor.  Jonelle SidleSamuel G. Kenley Rettinger, M.D., F.A.C.C.

## 2016-08-21 ENCOUNTER — Encounter (HOSPITAL_COMMUNITY)
Admission: AD | Disposition: A | Discharge status: Home / Self Care | Payer: Self-pay | Source: Other Acute Inpatient Hospital | Attending: Cardiology

## 2016-08-21 DIAGNOSIS — I25110 Atherosclerotic heart disease of native coronary artery with unstable angina pectoris: Secondary | ICD-10-CM

## 2016-08-21 LAB — BASIC METABOLIC PANEL
ANION GAP: 10 (ref 5–15)
BUN: 17 mg/dL (ref 6–20)
CALCIUM: 9 mg/dL (ref 8.9–10.3)
CO2: 26 mmol/L (ref 22–32)
CREATININE: 0.87 mg/dL (ref 0.44–1.00)
Chloride: 103 mmol/L (ref 101–111)
Glucose, Bld: 123 mg/dL — ABNORMAL HIGH (ref 65–99)
Potassium: 3.9 mmol/L (ref 3.5–5.1)
Sodium: 139 mmol/L (ref 135–145)

## 2016-08-21 LAB — CBC
HEMATOCRIT: 27.4 % — AB (ref 36.0–46.0)
HEMOGLOBIN: 8.8 g/dL — AB (ref 12.0–15.0)
MCH: 29.4 pg (ref 26.0–34.0)
MCHC: 32.1 g/dL (ref 30.0–36.0)
MCV: 91.6 fL (ref 78.0–100.0)
PLATELETS: 187 10*3/uL (ref 150–400)
RBC: 2.99 MIL/uL — AB (ref 3.87–5.11)
RDW: 15.5 % (ref 11.5–15.5)
WBC: 5.9 10*3/uL (ref 4.0–10.5)

## 2016-08-21 LAB — LIPID PANEL
CHOL/HDL RATIO: 3.8 ratio
Cholesterol: 152 mg/dL (ref 0–200)
HDL: 40 mg/dL — ABNORMAL LOW (ref >40)
LDL CALC: UNDETERMINED mg/dL (ref 0–99)
Triglycerides: 593 mg/dL — ABNORMAL HIGH (ref <150)
VLDL: UNDETERMINED mg/dL (ref 0–40)

## 2016-08-21 LAB — HEMOGLOBIN A1C
Hgb A1c MFr Bld: 7.1 % — ABNORMAL HIGH (ref 4.8–5.6)
Mean Plasma Glucose: 157 mg/dL

## 2016-08-21 LAB — HEPARIN LEVEL (UNFRACTIONATED)
HEPARIN UNFRACTIONATED: 0.15 [IU]/mL — AB (ref 0.30–0.70)
Heparin Unfractionated: 0.52 IU/mL (ref 0.30–0.70)

## 2016-08-21 LAB — PROTIME-INR
INR: 1.05
PROTHROMBIN TIME: 13.7 s (ref 11.4–15.2)

## 2016-08-21 LAB — TROPONIN I
Troponin I: 0.77 ng/mL (ref <0.03)
Troponin I: 0.64 ng/mL (ref <0.03)
Troponin I: 0.64 ng/mL (ref <0.03)

## 2016-08-21 SURGERY — LEFT HEART CATH AND CORS/GRAFTS ANGIOGRAPHY

## 2016-08-21 MED ORDER — HEPARIN BOLUS VIA INFUSION
2500.0000 [IU] | Freq: Once | INTRAVENOUS | Status: AC
  Administered 2016-08-21: 2500 [IU] via INTRAVENOUS
  Filled 2016-08-21: qty 2500

## 2016-08-21 NOTE — Care Management Note (Signed)
Case Management Note  Patient Details  Name: Ann Hawkins MRN: [id number] Date of Birth: January 01, 1976  Subjective/Objective:        Adm w nstemi, for card cath.            Action/Plan:was living at home. Pt has medicaid for meds. Her copays will be 1.00 to 3.00 per prescription.    Expected Discharge Date:  08/23/16               Expected Discharge Plan:  Home/Self Care  In-House Referral:     Discharge planning Services  CM Consult  Post Acute Care Choice:    Choice offered to:     DME Arranged:    DME Agency:     HH Arranged:    HH Agency:     Status of Service:  In process, will continue to follow  If discussed at Long Length of Stay Meetings, dates discussed:    Additional Comments: will moniter  Hanley HaysDowell, Slater Mcmanaman T, RN 08/21/2016, 9:52 AM

## 2016-08-21 NOTE — Progress Notes (Signed)
Hospital Problem List     Active Problems:   NSTEMI (non-ST elevated myocardial infarction) [facility](HCC)     Patient Profile:   Ann Hawkins is a 40 year old female with an extensive cardiac history including premature multivessel CAD s/p CABG in 2006 with revision in 2012, PAD, DM II, former smoker, strong family history of CAD, obesity, non-compliance presenting with 4 day history of chest pain, diaphoresis, nausea.   Subjective   No events overnight. Has continued chest pressure and bilateral hand numbness. Improved somewhat since admission. Agreeable to having LHC.   Inpatient Medications    . aspirin EC  81 mg Oral Daily  . atorvastatin  80 mg Oral QPC supper  . clopidogrel  75 mg Oral Daily  . [START ON 08/22/2016] furosemide  40 mg Oral Daily  . gabapentin  600 mg Oral TID  . insulin glargine  24 Units Subcutaneous BID  . metoprolol succinate  25 mg Oral Daily  . [START ON 08/22/2016] potassium chloride  20 mEq Oral Daily  . sodium chloride flush  3 mL Intravenous Q12H    Vital Signs    Vitals:   08/21/16 0100 08/21/16 0302 08/21/16 0800 08/21/16 0804  BP: 129/67 (!) 148/75  (!) 144/77  Pulse: 78 95 98 89  Resp: 11 14 18 12   Temp:  98.7 F (37.1 C)  98.3 F (36.8 C)  TempSrc:  Oral  Oral  SpO2: 98% 95% 100% 97%  Weight:  213 lb 3 oz (96.7 kg)    Height:        Intake/Output Summary (Last 24 hours) at 08/21/16 0816 Last data filed at 08/21/16 0800  Gross per 24 hour  Intake          1064.03 ml  Output             1050 ml  Net            14.03 ml   Filed Weights   08/20/16 1933 08/21/16 0302  Weight: 217 lb 9.5 oz (98.7 kg) 213 lb 3 oz (96.7 kg)    Physical Exam    General: Well developed, well nourished, female appearing in no acute distress. Head: Normocephalic, atraumatic.  Neck: Supple without bruits, JVD. Lungs:  Resp regular and unlabored, CTA. Heart: RRR, S1, S2, no S3, S4, soft systolic murmur; no rub. Abdomen: Soft, non-tender, non-distended with  normoactive bowel sounds. No hepatomegaly. No rebound/guarding. No obvious abdominal masses. Extremities: No clubbing, cyanosis, trace pedal edema. Distal pedal pulses are 1+ bilaterally. Neuro: Alert and oriented X 3. Moves all extremities spontaneously. Psych: Normal affect.  Labs    CBC  Recent Labs  08/20/16 2214  WBC 6.6  HGB 8.9*  HCT 28.6*  MCV 91.7  PLT 208   Basic Metabolic Panel No results for input(s): NA, K, CL, CO2, GLUCOSE, BUN, CREATININE, CALCIUM, MG, PHOS in the last 72 hours. Liver Function Tests No results for input(s): AST, ALT, ALKPHOS, BILITOT, PROT, ALBUMIN in the last 72 hours. No results for input(s): LIPASE, AMYLASE in the last 72 hours. Cardiac Enzymes  Recent Labs  08/20/16 2214 08/21/16 0155  TROPONINI 0.77* 0.64*   BNP Invalid input(s): POCBNP D-Dimer No results for input(s): DDIMER in the last 72 hours. Hemoglobin A1C No results for input(s): HGBA1C in the last 72 hours. Fasting Lipid Panel  Recent Labs  08/21/16 0155  CHOL 152  HDL 40*  LDLCALC UNABLE TO CALCULATE IF TRIGLYCERIDE OVER 400 mg/dL  TRIG [id number]*  CHOLHDL 3.8   Thyroid Function Tests No results for input(s): TSH, T4TOTAL, T3FREE, THYROIDAB in the last 72 hours.  Invalid input(s): FREET3  Telemetry    NSR  ECG    NSR, ST depression in lateral leads and diffuse non-specific ST changes   Cardiac Studies and Radiology    No results found.  Echocardiogram: pending  Assessment & Plan    1. NSTEMI: Presentation with prolonged, waxing and waning chest discomfort having both typical and atypical features. Patient states similar to previous angina. Troponin I levels are mildly elevated with peak at 0.97 at outside hospital. ECG abnormal consistent with old inferior infarct and fairly diffuse nonspecific ST-T changes. Concern is for ACS/NSTEMI, particularly in light of aggressive vascular disease history. Will plan for LHC today. On heparin gtt.   2. Premature  multivessel CAD status post CABG in 2006 with redo operation in 2012. She has not had regular cardiology follow-up since that time. [facility] reported a small region of ischemia in the basal anterior and anterolateral walls with inferolateral attenuation.  3. Peripheral arterial disease status post aortobifemoral bypass grafting in 2006 with redo operation in [facility]. She required thrombectomy at that time and also developed compartment syndrome that required surgical treatment.  4. Tobacco abuse in remission, patient denies smoking since 2012.  5. Chronic anemia, recent hemoglobin 9.9. Reports no melena or hematochezia.  6. Hyperlipidemia, Lipid panel with TG > 500. On Atorvastatin 40 mg at home. Needs dietary and lifestyle modification. Consider adding fibrate.   7. Recent abdominal discomfort, etiology not clear as yet. CT imaging at outside hospital as outlined above. She does have a prior ventral hernia repair with small and large bowel loops present in hernia but no obvious compromise based on imaging study. LFTs are normal. Evidence of hepatic steatosis noted.  8. Essential hypertension.  SignedValentino Nose, Nathan Boswell PGY2 IM Resident [phone] 8:16 AM 08/21/2016  Attending Note:   The patient was seen and examined.  Agree with assessment and plan as noted above.  Changes made to the above note as needed.  Patient seen and independently examined with Valentino NoseNathan Boswell, resident MD .   We discussed all aspects of the encounter. I agree with the assessment and plan as stated above.  Pt has a significant cardiac hx - at a very young age Presents with worsening angina. She will need a cardiac cath .  2. Hyperlipidemia  Her trigs are very elevated.   It is clear that she does not follow a good diet She seems to have come cognitive deficits.  Add fenofibrate.   3. PVD -   4. HTn - continue meds.    I have spent a total of 40 minutes with  patient reviewing hospital  notes , telemetry, EKGs, labs and examining patient as well as establishing an assessment and plan that was discussed with the patient. > 50% of time was spent in direct patient care.    Vesta MixerPhilip J. Ayman Brull, Montez HagemanJr., MD, [facility] 08/21/2016, 8:50 AM [address],  [address] Office [phone] Pager [phone]

## 2016-08-21 NOTE — Progress Notes (Signed)
Lab value called of trop 0.64 slightly decreased from previous labs.  Pt receiving heparin gtt. Will continue to monitor. Karena Addisonoro, Haja Crego T

## 2016-08-21 NOTE — H&P (View-Only) (Signed)
  Hospital Problem List     Active Problems:   NSTEMI (non-ST elevated myocardial infarction) (HCC)     Patient Profile:   Ann Hawkins is a 40 year old female with an extensive cardiac history including premature multivessel CAD s/p CABG in 2006 with revision in 2012, PAD, DM II, former smoker, strong family history of CAD, obesity, non-compliance presenting with 4 day history of chest pain, diaphoresis, nausea.   Subjective   No events overnight. Has continued chest pressure and bilateral hand numbness. Improved somewhat since admission. Agreeable to having LHC.   Inpatient Medications    . aspirin EC  81 mg Oral Daily  . atorvastatin  80 mg Oral QPC supper  . clopidogrel  75 mg Oral Daily  . [START ON 08/22/2016] furosemide  40 mg Oral Daily  . gabapentin  600 mg Oral TID  . insulin glargine  24 Units Subcutaneous BID  . metoprolol succinate  25 mg Oral Daily  . [START ON 08/22/2016] potassium chloride  20 mEq Oral Daily  . sodium chloride flush  3 mL Intravenous Q12H    Vital Signs    Vitals:   08/21/16 0100 08/21/16 0302 08/21/16 0800 08/21/16 0804  BP: 129/67 (!) 148/75  (!) 144/77  Pulse: 78 95 98 89  Resp: 11 14 18 12  Temp:  98.7 F (37.1 C)  98.3 F (36.8 C)  TempSrc:  Oral  Oral  SpO2: 98% 95% 100% 97%  Weight:  213 lb 3 oz (96.7 kg)    Height:        Intake/Output Summary (Last 24 hours) at 08/21/16 0816 Last data filed at 08/21/16 0800  Gross per 24 hour  Intake          1064.03 ml  Output             1050 ml  Net            14.03 ml   Filed Weights   08/20/16 1933 08/21/16 0302  Weight: 217 lb 9.5 oz (98.7 kg) 213 lb 3 oz (96.7 kg)    Physical Exam    General: Well developed, well nourished, female appearing in no acute distress. Head: Normocephalic, atraumatic.  Neck: Supple without bruits, JVD. Lungs:  Resp regular and unlabored, CTA. Heart: RRR, S1, S2, no S3, S4, soft systolic murmur; no rub. Abdomen: Soft, non-tender, non-distended with  normoactive bowel sounds. No hepatomegaly. No rebound/guarding. No obvious abdominal masses. Extremities: No clubbing, cyanosis, trace pedal edema. Distal pedal pulses are 1+ bilaterally. Neuro: Alert and oriented X 3. Moves all extremities spontaneously. Psych: Normal affect.  Labs    CBC  Recent Labs  08/20/16 2214  WBC 6.6  HGB 8.9*  HCT 28.6*  MCV 91.7  PLT 208   Basic Metabolic Panel No results for input(s): NA, K, CL, CO2, GLUCOSE, BUN, CREATININE, CALCIUM, MG, PHOS in the last 72 hours. Liver Function Tests No results for input(s): AST, ALT, ALKPHOS, BILITOT, PROT, ALBUMIN in the last 72 hours. No results for input(s): LIPASE, AMYLASE in the last 72 hours. Cardiac Enzymes  Recent Labs  08/20/16 2214 08/21/16 0155  TROPONINI 0.77* 0.64*   BNP Invalid input(s): POCBNP D-Dimer No results for input(s): DDIMER in the last 72 hours. Hemoglobin A1C No results for input(s): HGBA1C in the last 72 hours. Fasting Lipid Panel  Recent Labs  08/21/16 0155  CHOL 152  HDL 40*  LDLCALC UNABLE TO CALCULATE IF TRIGLYCERIDE OVER 400 mg/dL  TRIG 593*    CHOLHDL 3.8   Thyroid Function Tests No results for input(s): TSH, T4TOTAL, T3FREE, THYROIDAB in the last 72 hours.  Invalid input(s): FREET3  Telemetry    NSR  ECG    NSR, ST depression in lateral leads and diffuse non-specific ST changes   Cardiac Studies and Radiology    No results found.  Echocardiogram: pending  Assessment & Plan    1. NSTEMI: Presentation with prolonged, waxing and waning chest discomfort having both typical and atypical features. Patient states similar to previous angina. Troponin I levels are mildly elevated with peak at 0.97 at outside hospital. ECG abnormal consistent with old inferior infarct and fairly diffuse nonspecific ST-T changes. Concern is for ACS/NSTEMI, particularly in light of aggressive vascular disease history. Will plan for LHC today. On heparin gtt.   2. Premature  multivessel CAD status post CABG in 2006 with redo operation in 2012. She has not had regular cardiology follow-up since that time. [facility] reported a small region of ischemia in the basal anterior and anterolateral walls with inferolateral attenuation.  3. Peripheral arterial disease status post aortobifemoral bypass grafting in 2006 with [facility]. She required thrombectomy at that time and also developed compartment syndrome that required surgical treatment.  4. Tobacco abuse in remission, patient denies smoking since 2012.  5. Chronic anemia, recent hemoglobin 9.9. Reports no melena or hematochezia.  6. Hyperlipidemia, Lipid panel with TG > 500. On Atorvastatin 40 mg at home. Needs dietary and lifestyle modification. Consider adding fibrate.   7. Recent abdominal discomfort, etiology not clear as yet. CT imaging at outside hospital as outlined above. She does have a prior ventral hernia repair with small and large bowel loops present in hernia but no obvious compromise based on imaging study. LFTs are normal. Evidence of hepatic steatosis noted.  8. Essential hypertension.  SignedValentino Nose, Nathan Boswell PGY2 IM Resident [phone] 8:16 AM 08/21/2016  Attending Note:   The patient was seen and examined.  Agree with assessment and plan as noted above.  Changes made to the above note as needed.  Patient seen and independently examined with Valentino NoseNathan Boswell, resident MD .   We discussed all aspects of the encounter. I agree with the assessment and plan as stated above.  Pt has a significant cardiac hx - at a very young age Presents with worsening angina. She will need a cardiac cath .  2. Hyperlipidemia  Her trigs are very elevated.   It is clear that she does not follow a good diet She seems to have come cognitive deficits.  Add fenofibrate.   3. PVD -   4. HTn - continue meds.    I have spent a total of 40 minutes with  patient reviewing hospital  notes , telemetry, EKGs, labs and examining patient as well as establishing an assessment and plan that was discussed with the patient. > 50% of time was spent in direct patient care.    Vesta MixerPhilip J. Nahser, Montez HagemanJr., MD, [facility] Hospital[facility]ACC 08/21/2016, 8:50 AM [address],  [address] Office [phone] Pager [phone]- [phone]

## 2016-08-21 NOTE — Interval H&P Note (Signed)
History and Physical Interval Note:  08/21/2016 4:59 PM  Ann Hawkins  has presented today for surgery, with the diagnosis of non-STEMI. The various methods of treatment have been discussed with the patient and family. After consideration of risks, benefits and other options for treatment, the patient has consented to  Procedure(s): Left Heart Cath and Cors/Grafts Angiography (N/A) with possible Percutaneous Coronary Intervention as a surgical intervention .  The patient's history has been reviewed, patient examined, no change in status, stable for surgery.  I have reviewed the patient's chart and labs.  Questions were answered to the patient's satisfaction.     Cath Lab Visit (complete for each Cath Lab visit)  Clinical Evaluation Leading to the Procedure:   ACS: Yes.    Non-ACS:    Anginal Classification: CCS IV  Anti-ischemic medical therapy: Minimal Therapy (1 class of medications)  Non-Invasive Test Results: No non-invasive testing performed  Prior CABG: Previous CABG   Bryan Lemmaavid Harding

## 2016-08-21 NOTE — Progress Notes (Signed)
ANTICOAGULATION CONSULT NOTE - Follow Up Consult  Pharmacy Consult for heparin Indication: chest pain/ACS  Labs:  Recent Labs  08/20/16 2214 08/21/16 0155 08/21/16 0308  HGB 8.9*  --   --   HCT 28.6*  --   --   PLT 208  --   --   HEPARINUNFRC 0.13*  --  0.15*  TROPONINI 0.77* 0.64*  --     Assessment: 40yo female remains subtherapeutic on heparin for NSTEMI. Heparin level drawn ~1.5hr early so may even be lower as rebolused ~ midnight. No issues with line or bleeding reported per RN.  Goal of Therapy:  Heparin level 0.3-0.7 units/ml   Plan:  Will rebolus with heparin 2500 units and increase gtt to 1900 units/hr and check level in 6hr.  Christoper Fabianaron Kam Rahimi, PharmD, BCPS Clinical pharmacist, pager [phone] 08/21/2016,4:49 AM

## 2016-08-21 NOTE — Progress Notes (Signed)
ANTICOAGULATION CONSULT NOTE - Follow Up Consult  Pharmacy Consult for heparin Indication: chest pain/ACS  Labs:  Recent Labs  08/20/16 2214 08/21/16 0155 08/21/16 0308 08/21/16 0821 08/21/16 1107  HGB 8.9*  --   --  8.8*  --   HCT 28.6*  --   --  27.4*  --   PLT 208  --   --  187  --   LABPROT  --   --   --  13.7  --   INR  --   --   --  1.05  --   HEPARINUNFRC 0.13*  --  0.15*  --  0.52  CREATININE  --   --   --  0.87  --   TROPONINI 0.77* 0.64*  --  0.64*  --     Assessment: 40yo female remains subtherapeutic on heparin for NSTEMI. Heparin level is at goal after increase to 1900 units/hr. Plans noted for cath.   Goal of Therapy:  Heparin level 0.3-0.7 units/ml   Plan -no heparin changes needed -Will follow plans post cath  Ann Hawkins, Pharm D 08/21/2016 11:54 AM

## 2016-08-22 ENCOUNTER — Inpatient Hospital Stay (HOSPITAL_COMMUNITY): Payer: Medicaid Other

## 2016-08-22 ENCOUNTER — Encounter (HOSPITAL_COMMUNITY)
Admission: AD | Disposition: A | Discharge status: Home / Self Care | Payer: Self-pay | Source: Other Acute Inpatient Hospital | Attending: Cardiology

## 2016-08-22 ENCOUNTER — Encounter (HOSPITAL_COMMUNITY): Payer: Self-pay | Admitting: Cardiology

## 2016-08-22 DIAGNOSIS — R079 Chest pain, unspecified: Secondary | ICD-10-CM

## 2016-08-22 DIAGNOSIS — I25810 Atherosclerosis of coronary artery bypass graft(s) without angina pectoris: Secondary | ICD-10-CM

## 2016-08-22 HISTORY — PX: CARDIAC CATHETERIZATION: SHX172

## 2016-08-22 LAB — ECHOCARDIOGRAM COMPLETE
Height: 61 in
Weight: 3410.96 oz

## 2016-08-22 LAB — CBC
HEMATOCRIT: 27.6 % — AB (ref 36.0–46.0)
Hemoglobin: 8.8 g/dL — ABNORMAL LOW (ref 12.0–15.0)
MCH: 29 pg (ref 26.0–34.0)
MCHC: 31.9 g/dL (ref 30.0–36.0)
MCV: 91.1 fL (ref 78.0–100.0)
Platelets: 217 10*3/uL (ref 150–400)
RBC: 3.03 MIL/uL — AB (ref 3.87–5.11)
RDW: 15.3 % (ref 11.5–15.5)
WBC: 5.3 10*3/uL (ref 4.0–10.5)

## 2016-08-22 LAB — CREATININE, SERUM: Creatinine, Ser: 0.88 mg/dL (ref 0.44–1.00)

## 2016-08-22 LAB — POCT ACTIVATED CLOTTING TIME: ACTIVATED CLOTTING TIME: 709 s

## 2016-08-22 LAB — LDL CHOLESTEROL, DIRECT: Direct LDL: 48 mg/dL (ref 0–99)

## 2016-08-22 SURGERY — LEFT HEART CATH AND CORS/GRAFTS ANGIOGRAPHY
Anesthesia: LOCAL

## 2016-08-22 MED ORDER — SODIUM CHLORIDE 0.9% FLUSH: 3.0000 mL | INTRAVENOUS | Status: DC | PRN

## 2016-08-22 MED ORDER — NITROGLYCERIN 1 MG/10 ML FOR IR/CATH LAB
INTRA_ARTERIAL | Status: DC | PRN
Start: 2016-08-22 — End: 2016-08-22
  Administered 2016-08-22 (×2): 200 ug via INTRACORONARY

## 2016-08-22 MED ORDER — HYDRALAZINE HCL 20 MG/ML IJ SOLN
INTRAMUSCULAR | Status: DC | PRN
  Administered 2016-08-22 (×2): 10 mg via INTRAVENOUS

## 2016-08-22 MED ORDER — FUROSEMIDE 10 MG/ML IJ SOLN
INTRAMUSCULAR | Status: AC
  Filled 2016-08-22: qty 4

## 2016-08-22 MED ORDER — LIDOCAINE HCL (PF) 1 % IJ SOLN
INTRAMUSCULAR | Status: AC
  Filled 2016-08-22: qty 30

## 2016-08-22 MED ORDER — SODIUM CHLORIDE 0.9 % IV SOLN: 250.0000 mL | INTRAVENOUS | Status: DC | PRN

## 2016-08-22 MED ORDER — FUROSEMIDE 10 MG/ML IJ SOLN
INTRAMUSCULAR | Status: DC | PRN
  Administered 2016-08-22: 40 mg via INTRAVENOUS

## 2016-08-22 MED ORDER — DIPHENHYDRAMINE HCL 50 MG/ML IJ SOLN
INTRAMUSCULAR | Status: AC
Start: 2016-08-22 — End: 2016-08-22
  Filled 2016-08-22: qty 1

## 2016-08-22 MED ORDER — SODIUM CHLORIDE 0.9 % IV SOLN
INTRAVENOUS | Status: DC | PRN
  Administered 2016-08-22: 98.7 mL/h via INTRAVENOUS

## 2016-08-22 MED ORDER — FENTANYL CITRATE (PF) 100 MCG/2ML IJ SOLN
INTRAMUSCULAR | Status: AC
  Filled 2016-08-22: qty 2

## 2016-08-22 MED ORDER — PERFLUTREN LIPID MICROSPHERE
1.0000 mL | INTRAVENOUS | Status: AC | PRN
  Administered 2016-08-22: 2 mL via INTRAVENOUS
  Filled 2016-08-22 (×2): qty 10

## 2016-08-22 MED ORDER — IOPAMIDOL (ISOVUE-370) INJECTION 76%
INTRAVENOUS | Status: DC | PRN
  Administered 2016-08-22: 200 mL via INTRA_ARTERIAL

## 2016-08-22 MED ORDER — FUROSEMIDE 10 MG/ML IJ SOLN
40.0000 mg | Freq: Two times a day (BID) | INTRAMUSCULAR | Status: DC
  Administered 2016-08-22 – 2016-08-23 (×3): 40 mg via INTRAVENOUS
  Filled 2016-08-22 (×3): qty 4

## 2016-08-22 MED ORDER — IOPAMIDOL (ISOVUE-370) INJECTION 76%
INTRAVENOUS | Status: AC
  Filled 2016-08-22: qty 100

## 2016-08-22 MED ORDER — FENTANYL CITRATE (PF) 100 MCG/2ML IJ SOLN
INTRAMUSCULAR | Status: DC | PRN
  Administered 2016-08-22 (×2): 25 ug via INTRAVENOUS
  Administered 2016-08-22 (×2): 50 ug via INTRAVENOUS

## 2016-08-22 MED ORDER — BIVALIRUDIN 250 MG IV SOLR
INTRAVENOUS | Status: AC
  Filled 2016-08-22: qty 250

## 2016-08-22 MED ORDER — DIPHENHYDRAMINE HCL 50 MG/ML IJ SOLN
25.0000 mg | Freq: Once | INTRAMUSCULAR | Status: AC
  Administered 2016-08-22: 25 mg via INTRAVENOUS

## 2016-08-22 MED ORDER — LISINOPRIL 20 MG PO TABS
20.0000 mg | ORAL_TABLET | Freq: Every day | ORAL | Status: DC
  Administered 2016-08-22 – 2016-08-23 (×2): 20 mg via ORAL
  Filled 2016-08-22 (×2): qty 1

## 2016-08-22 MED ORDER — ENOXAPARIN SODIUM 40 MG/0.4ML ~~LOC~~ SOLN
40.0000 mg | SUBCUTANEOUS | Status: DC
  Administered 2016-08-23: 40 mg via SUBCUTANEOUS
  Filled 2016-08-22 (×2): qty 0.4

## 2016-08-22 MED ORDER — HEPARIN (PORCINE) IN NACL 2-0.9 UNIT/ML-% IJ SOLN
INTRAMUSCULAR | Status: AC
  Filled 2016-08-22: qty 1000

## 2016-08-22 MED ORDER — NITROGLYCERIN 1 MG/10 ML FOR IR/CATH LAB
INTRA_ARTERIAL | Status: AC
  Filled 2016-08-22: qty 10

## 2016-08-22 MED ORDER — HEPARIN (PORCINE) IN NACL 2-0.9 UNIT/ML-% IJ SOLN
INTRAMUSCULAR | Status: DC | PRN
  Administered 2016-08-22: 1000 mL

## 2016-08-22 MED ORDER — BIVALIRUDIN BOLUS VIA INFUSION - CUPID
INTRAVENOUS | Status: DC | PRN
Start: 2016-08-22 — End: 2016-08-22
  Administered 2016-08-22: 72.525 mg via INTRAVENOUS

## 2016-08-22 MED ORDER — SODIUM CHLORIDE 0.9% FLUSH: 3.0000 mL | Freq: Two times a day (BID) | INTRAVENOUS | Status: DC

## 2016-08-22 MED ORDER — MIDAZOLAM HCL 2 MG/2ML IJ SOLN
INTRAMUSCULAR | Status: AC
  Filled 2016-08-22: qty 2

## 2016-08-22 MED ORDER — MIDAZOLAM HCL 2 MG/2ML IJ SOLN
INTRAMUSCULAR | Status: DC | PRN
  Administered 2016-08-22 (×2): 1 mg via INTRAVENOUS
  Administered 2016-08-22: 2 mg via INTRAVENOUS

## 2016-08-22 MED ORDER — MORPHINE SULFATE (PF) 4 MG/ML IV SOLN
1.0000 mg | INTRAVENOUS | Status: DC | PRN
  Administered 2016-08-22 (×2): 1 mg via INTRAVENOUS
  Filled 2016-08-22 (×2): qty 1

## 2016-08-22 MED ORDER — SODIUM CHLORIDE 0.9 % IV SOLN
INTRAVENOUS | Status: DC | PRN
  Administered 2016-08-22: 1.75 mg/kg/h via INTRAVENOUS

## 2016-08-22 MED ORDER — SODIUM CHLORIDE 0.9% FLUSH
3.0000 mL | Freq: Two times a day (BID) | INTRAVENOUS | Status: DC
  Administered 2016-08-22 – 2016-08-23 (×3): 3 mL via INTRAVENOUS

## 2016-08-22 MED ORDER — LIDOCAINE HCL (PF) 1 % IJ SOLN
INTRAMUSCULAR | Status: DC | PRN
  Administered 2016-08-22: 20 mL via SUBCUTANEOUS

## 2016-08-22 MED ORDER — IOPAMIDOL (ISOVUE-370) INJECTION 76%
INTRAVENOUS | Status: AC
  Filled 2016-08-22: qty 50

## 2016-08-22 SURGICAL SUPPLY — 21 items
BALLN EMERGE MR 2.5X12 (BALLOONS) ×2
BALLN ~~LOC~~ MOZEC 4.0X8 (BALLOONS) ×2
BALLOON EMERGE MR 2.5X12 (BALLOONS) ×1 IMPLANT
BALLOON ~~LOC~~ MOZEC 4.0X8 (BALLOONS) ×1 IMPLANT
CATH INFINITI 5 FR RCB (CATHETERS) ×2 IMPLANT
CATH INFINITI 5FR MULTPACK ANG (CATHETERS) ×2 IMPLANT
CATH VISTA GUIDE 6FR AL1 (CATHETERS) ×2 IMPLANT
COVER PRB 48X5XTLSCP FOLD TPE (BAG) ×1 IMPLANT
COVER PROBE 5X48 (BAG) ×1
KIT ENCORE 26 ADVANTAGE (KITS) ×2 IMPLANT
KIT HEART LEFT (KITS) ×2 IMPLANT
PACK CARDIAC CATHETERIZATION (CUSTOM PROCEDURE TRAY) ×2 IMPLANT
SHEATH PINNACLE 5F 10CM (SHEATH) ×2 IMPLANT
SHEATH PINNACLE 6F 10CM (SHEATH) ×2 IMPLANT
STENT SYNERGY DES 3.5X12 (Permanent Stent) ×2 IMPLANT
SYR MEDRAD MARK V 150ML (SYRINGE) IMPLANT
TRANSDUCER W/STOPCOCK (MISCELLANEOUS) ×2 IMPLANT
TUBING CIL FLEX 10 FLL-RA (TUBING) ×2 IMPLANT
WIRE ASAHI PROWATER 180CM (WIRE) ×2 IMPLANT
WIRE EMERALD ST .035X150CM (WIRE) ×2 IMPLANT
WIRE HI TORQ VERSACORE-J 145CM (WIRE) ×2 IMPLANT

## 2016-08-22 NOTE — Progress Notes (Signed)
PTs right femoral sheath was removed per MD order at 12:45. Manual pressure was held for 20 minutes. Site is a level zero. No complications. Patient remained stable through whole process. Patient's bedrest ends at 16:45.  Glade LloydMelissa Snow, RN

## 2016-08-22 NOTE — Progress Notes (Signed)
Pt welts on right forearm after administering morphine for the second time today. NP notified, Benedryl ordered. Benedryl given. Pt with no other symptoms other than welts on right forearm close to the IV that it was administered in. Will continue to monitor closely.

## 2016-08-22 NOTE — Progress Notes (Signed)
Heparin gtt stopped on call to cath lab. ASA and Plavix given per transcribed order.

## 2016-08-22 NOTE — Progress Notes (Signed)
  Echocardiogram 2D Echocardiogram with Definity has been performed.  Janalyn HarderWest, Deward Sebek R 08/22/2016, 3:24 PM

## 2016-08-22 NOTE — Interval H&P Note (Signed)
History and Physical Interval Note:  08/22/2016 8:38 AM  Ann Hawkins  has presented today for surgery, with the diagnosis of CP  The various methods of treatment have been discussed with the patient and family. After consideration of risks, benefits and other options for treatment, the patient has consented to  Procedure(s): Left Heart Cath and Cors/Grafts Angiography (N/A) as a surgical intervention .  The patient's history has been reviewed, patient examined, no change in status, stable for surgery.  I have reviewed the patient's chart and labs.  Questions were answered to the patient's satisfaction.   Cath Lab Visit (complete for each Cath Lab visit)  Clinical Evaluation Leading to the Procedure:   ACS: Yes.    Non-ACS:    Anginal Classification: CCS IV  Anti-ischemic medical therapy: Minimal Therapy (1 class of medications)  Non-Invasive Test Results: No non-invasive testing performed  Prior CABG: Previous CABG        Theron Aristaeter [facility] Rut[facility] HospitalJordanMD,FACC 08/22/2016 8:38 AM

## 2016-08-22 NOTE — Progress Notes (Signed)
Sheath removed at 12:45. Bed rest will end at 16:45 per transcribed order. Left femoral level zero.

## 2016-08-22 NOTE — Progress Notes (Signed)
Hospital Problem List     Active Problems:   NSTEMI (non-ST elevated myocardial infarction) [facility](HCC)     Patient Profile:   Primary Cardiologist: None  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   Subjective   No events overnight. Reports her chest pain has resolved. No shortness of breath. Agreeable to LHC this morning.  Inpatient Medications    . aspirin EC  81 mg Oral Daily  . atorvastatin  80 mg Oral QPC supper  . clopidogrel  75 mg Oral Daily  . furosemide  40 mg Oral Daily  . gabapentin  600 mg Oral TID  . insulin glargine  24 Units Subcutaneous BID  . metoprolol succinate  25 mg Oral Daily  . potassium chloride  20 mEq Oral Daily  . sodium chloride flush  3 mL Intravenous Q12H    Vital Signs    Vitals:   08/21/16 1951 08/21/16 2000 08/22/16 0012 08/22/16 0336  BP: (!) 146/73  (!) 148/89 (!) 158/73  Pulse: (!) 53 76 (!) 103 83  Resp: 11 10 15 13   Temp: 98.3 F (36.8 C)  97.4 F (36.3 C) 98.3 F (36.8 C)  TempSrc: Oral  Oral Oral  SpO2: 98% 95% 95% 97%  Weight:      Height:        Intake/Output Summary (Last 24 hours) at 08/22/16 0754 Last data filed at 08/22/16 0400  Gross per 24 hour  Intake          1722.75 ml  Output             1475 ml  Net           247.75 ml   Filed Weights   08/20/16 1933 08/21/16 0302  Weight: 217 lb 9.5 oz (98.7 kg) 213 lb 3 oz (96.7 kg)    Physical Exam    General: Well developed, well nourished, female appearing in no acute distress. Head: Normocephalic, atraumatic.      Neck: Supple without bruits, JVD. Lungs:  Resp regular and unlabored, CTA. Heart: RRR, S1, S2, no S3, S4, soft systolic murmur; no rub. Abdomen: Soft, non-tender, non-distended with normoactive bowel  sounds. No hepatomegaly. No rebound/guarding. No obvious abdominal masses. Extremities: No clubbing, cyanosis, trace pedal edema. Distal pedal pulses are 1+ bilaterally. Neuro: Alert and oriented X 3. Moves all extremities spontaneously. Psych: Normal affect.  Labs    CBC  Recent Labs  08/21/16 0821 08/22/16 0602  WBC 5.9 5.3  HGB 8.8* 8.8*  HCT 27.4* 27.6*  MCV 91.6 91.1  PLT 187 217   Basic Metabolic Panel  Recent Labs  08/21/16 0821  NA 139  K 3.9  CL 103  CO2 26  GLUCOSE 123*  BUN 17  CREATININE 0.87  CALCIUM 9.0   Liver Function Tests No results for input(s): AST, ALT, ALKPHOS, BILITOT, PROT, ALBUMIN in the last 72 hours. No results for input(s): LIPASE, AMYLASE in the last 72 hours. Cardiac Enzymes  Recent Labs  08/20/16 2214 08/21/16 0155 08/21/16 0821  TROPONINI 0.77* 0.64* 0.64*   BNP Invalid input(s): POCBNP D-Dimer No results for input(s): DDIMER in the last 72 hours. Hemoglobin A1C  Recent Labs  08/21/16 0308  HGBA1C 7.1*   Fasting Lipid Panel  Recent Labs  08/21/16 0155 08/21/16 0821  CHOL 152  --   HDL 40*  --   LDLCALC UNABLE TO CALCULATE IF TRIGLYCERIDE OVER 400 mg/dL  --   TRIG 161593*  --   CHOLHDL 3.8  --   LDLDIRECT  --  48   Thyroid Function Tests No results for input(s): TSH, T4TOTAL, T3FREE, THYROIDAB in the last 72 hours.  Invalid input(s): FREET3  Telemetry    NSR  ECG    NSR, non-specific ST changes   Cardiac Studies and Radiology    No results found.  Echocardiogram: pending  Assessment & Plan    1. NSTEMI: Chest pain has resolved. Has extensive cardiac history at a young age. Will go for cardiac cath today. Continue Heparin gtt.   2. Premature multivessel CAD status post CABG in 2006 with redo operation in 2012. She has not had regular cardiology follow-up since that time. [facility] 2015 reported a small region of ischemia in the basal anterior and anterolateral walls  with inferolateral attenuation.  3. Peripheral arterial disease status post aortobifemoral bypass grafting in 2006 with [facility]. She required thrombectomy at that time and also developed compartment syndrome that required surgical treatment.  4. Tobacco abuse in remission: patient denies smoking since 2012.  5. Chronic anemia: Hgb stable at 8.8.   6. Hyperlipidemia, Lipid panel with TG > 500. On Atorvastatin 40 mg at home. Needs dietary and lifestyle modification. Direct LDL is 48. -Started fenofibrate  7. Recent abdominal discomfort: chronic, improved  8. Essential hypertension: controlled. 140s systolic. Continue current medications.   Adria DillSigned, Nathan Boswell PGY2 IM Resident [phone] 7:54 AM 08/22/2016  Attending Note:   The patient was seen and examined.  Agree with assessment and plan as noted above.  Changes made to the above note as needed.  Patient seen and independently examined with Valentino NoseNathan Boswell, PGY2.   We discussed all aspects of the encounter. I agree with the assessment and plan as stated above.  1.   CAD:  Pt has had cath and PCI of the SVG to OM2 with DES Doing well. Continue current meds. Has extensive cardiac disease.  Needs better risk factor modification.   I have spent a total of 40 minutes with patient reviewing hospital  notes , telemetry, EKGs, labs and examining patient as well as establishing an assessment and plan that was discussed with the patient. > 50% of time was spent in direct patient care.    Vesta MixerPhilip J. Etheleen Valtierra, Montez HagemanJr., MD, [facility] HospitalFACC 08/22/2016, 6:29 PM [address],  [address] Office [phone] Pager [phone]

## 2016-08-23 LAB — BASIC METABOLIC PANEL
ANION GAP: 14 (ref 5–15)
BUN: 15 mg/dL (ref 6–20)
CALCIUM: 10.2 mg/dL (ref 8.9–10.3)
CO2: 27 mmol/L (ref 22–32)
Chloride: 95 mmol/L — ABNORMAL LOW (ref 101–111)
Creatinine, Ser: 1 mg/dL (ref 0.44–1.00)
GLUCOSE: 133 mg/dL — AB (ref 65–99)
POTASSIUM: 3.7 mmol/L (ref 3.5–5.1)
Sodium: 136 mmol/L (ref 135–145)

## 2016-08-23 LAB — CBC
HEMATOCRIT: 33.8 % — AB (ref 36.0–46.0)
HEMOGLOBIN: 10.5 g/dL — AB (ref 12.0–15.0)
MCH: 28.2 pg (ref 26.0–34.0)
MCHC: 31.1 g/dL (ref 30.0–36.0)
MCV: 90.9 fL (ref 78.0–100.0)
Platelets: 264 10*3/uL (ref 150–400)
RBC: 3.72 MIL/uL — AB (ref 3.87–5.11)
RDW: 15.4 % (ref 11.5–15.5)
WBC: 7.4 10*3/uL (ref 4.0–10.5)

## 2016-08-23 MED ORDER — FENOFIBRATE 54 MG PO TABS
54.0000 mg | ORAL_TABLET | Freq: Every day | ORAL | Status: DC
  Filled 2016-08-23: qty 1

## 2016-08-23 MED ORDER — FENOFIBRATE 160 MG PO TABS: 160.0000 mg | ORAL_TABLET | Freq: Every day | ORAL | 2 refills | Status: AC

## 2016-08-23 MED ORDER — FENOFIBRATE 160 MG PO TABS
160.0000 mg | ORAL_TABLET | Freq: Every day | ORAL | Status: DC
  Administered 2016-08-23: 160 mg via ORAL
  Filled 2016-08-23: qty 1

## 2016-08-23 MED ORDER — CLOPIDOGREL BISULFATE 75 MG PO TABS: 75.0000 mg | ORAL_TABLET | Freq: Every day | ORAL | 2 refills | Status: AC

## 2016-08-23 MED ORDER — ATORVASTATIN CALCIUM 80 MG PO TABS: 80.0000 mg | ORAL_TABLET | Freq: Every day | ORAL | 2 refills | Status: AC

## 2016-08-23 NOTE — Progress Notes (Signed)
CARDIAC REHAB PHASE I   PRE:  Rate/Rhythm: 106 ST  BP:  Supine: 129/73  Sitting:   Standing:    SaO2: 97%RA  MODE:  Ambulation: 170 ft   POST:  Rate/Rhythm: 114 ST  BP:  Supine:   Sitting: 110/85  Standing:    SaO2: 97%RA 1100-1149 Pt walked 170 ft on RA with slow pace. C/o groin hurting but denied CP. MI education completed with pt who voiced understanding. Becoming sleepy toward end of ed. Stressed importance of plavix with stent, reviewed NTG use, MI restrictions, ex ed, risk factors and importance of watching sodium and following diabetic and heart healthy diets. Encouraged 2000 mg restriction of sodium. Discussed CRP 2 and [facility].   Ann Nuttingharlene Jayleena Stille, RN BSN  08/23/2016 11:43 AM

## 2016-08-23 NOTE — Progress Notes (Signed)
Hospital Problem List     Active Problems:   NSTEMI (non-ST elevated myocardial infarction) [facility](HCC)     Patient Profile:   Primary Cardiologist: None  Ann Hawkins is a 40 year old female with an extensive cardiac history including premature multivessel CAD s/p CABG in 2006 with revision in 2012, PAD, DM II, former smoker, strong family history of CAD, obesity, non-compliance presenting with 4 day history of chest pain, diaphoresis, nausea.  Subjective   No events overnight. Denies any chest pain or shortness of breath. Has some tenderness and discomfort in her left groin at the catheter site. No bleeding.   Inpatient Medications    . aspirin EC  81 mg Oral Daily  . atorvastatin  80 mg Oral QPC supper  . clopidogrel  75 mg Oral Daily  . enoxaparin (LOVENOX) injection  40 mg Subcutaneous Q24H  . furosemide  40 mg Intravenous Q12H  . gabapentin  600 mg Oral TID  . insulin glargine  24 Units Subcutaneous BID  . lisinopril  20 mg Oral Daily  . metoprolol succinate  25 mg Oral Daily  . potassium chloride  20 mEq Oral Daily  . sodium chloride flush  3 mL Intravenous Q12H    Vital Signs    Vitals:   08/23/16 0000 08/23/16 0300 08/23/16 0336 08/23/16 0400  BP: 118/88 123/84 (!) 134/55 108/78  Pulse: (!) 111 96 91 87  Resp: 16 12 (!) 9 15  Temp:   97.6 F (36.4 C)   TempSrc:   Oral   SpO2: 97% 99% (!) 86% 95%  Weight:      Height:        Intake/Output Summary (Last 24 hours) at 08/23/16 0614 Last data filed at 08/22/16 1736  Gross per 24 hour  Intake          1537.95 ml  Output             4670 ml  Net         -3132.05 ml   Filed Weights   08/20/16 1933 08/21/16 0302  Weight: 217 lb 9.5 oz (98.7 kg) 213 lb 3 oz (96.7 kg)    Physical Exam    General: Well developed, well nourished, femaleappearing in no acute distress. Head: Normocephalic, atraumatic. Neck:Supple without bruits, JVD. Lungs:Resp regular and unlabored, CTA. Heart: RRR, S1, S2, no S3, S4,  soft systolicmurmur; no rub. Abdomen:Soft, non-tender, non-distended with normoactive bowel sounds. No hepatomegaly. No rebound/guarding. No obvious abdominal masses. Extremities: No clubbing, cyanosis, trace pedal edema. Distal pedal pulses are 1+ bilaterally. Neuro:Alert and oriented X 3. Moves all extremities spontaneously. Psych:Normal affect.  Labs    CBC  Recent Labs  08/22/16 0602 08/23/16 0207  WBC 5.3 7.4  HGB 8.8* 10.5*  HCT 27.6* 33.8*  MCV 91.1 90.9  PLT 217 264   Basic Metabolic Panel  Recent Labs  08/21/16 0821 08/22/16 0602 08/23/16 0207  NA 139  --  136  K 3.9  --  3.7  CL 103  --  95*  CO2 26  --  27  GLUCOSE 123*  --  133*  BUN 17  --  15  CREATININE 0.87 0.88 1.00  CALCIUM 9.0  --  10.2   Liver Function Tests No results for input(s): AST, ALT, ALKPHOS, BILITOT, PROT, ALBUMIN in the last 72 hours. No results for input(s): LIPASE, AMYLASE in the last 72 hours. Cardiac Enzymes  Recent Labs  08/20/16 2214 08/21/16 0155 08/21/16 [id number]  TROPONINI 0.77* 0.64* 0.64*   BNP Invalid input(s): POCBNP D-Dimer No results for input(s): DDIMER in the last 72 hours. Hemoglobin A1C  Recent Labs  08/21/16 0308  HGBA1C 7.1*   Fasting Lipid Panel  Recent Labs  08/21/16 0155 08/21/16 0821  CHOL 152  --   HDL 40*  --   LDLCALC UNABLE TO CALCULATE IF TRIGLYCERIDE OVER 400 mg/dL  --   TRIG 161*  --   CHOLHDL 3.8  --   LDLDIRECT  --  48   Thyroid Function Tests No results for input(s): TSH, T4TOTAL, T3FREE, THYROIDAB in the last 72 hours.  Invalid input(s): FREET3  Telemetry    NSR  ECG    11/7 NSR, non-specific ST changes   Cardiac Studies and Radiology    LHC:   LV end diastolic pressure is severely elevated.  Ost LM to LM lesion, 100 %stenosed.  Ost 1st Mrg to 1st Mrg lesion, 100 %stenosed.  Ost Cx to Mid Cx lesion, 100 %stenosed.  Ost LAD lesion, 100 %stenosed.  Prox RCA to Dist RCA lesion, 100 %stenosed.  LIMA graft  to the LAD was visualized by angiography and is large, very large and anatomically normal.  SVG graft to the PDA was not visualized due to occlusion.  Origin lesion, 100 %stenosed.  SVG graft to the OM 2 was visualized by angiography and is large.  Mid Graft to Dist Graft lesion, 90 %stenosed.  A STENT SYNERGY DES 3.5X12 drug eluting stent was successfully placed.  Post intervention, there is a 0% residual stenosis.   1. Native vessel occlusive disease involving the Left main, LAD, LCx, and RCA 2. Patent LIMA to the LAD 3. Patent SVG to OM 2 with 90% stenosis in distal SVG 4. Occluded SVG to PDA. The distal RCA branches fill by left to right and right to right collaterals. 5. Severely elevated LVEDP of 39 mm Hg 6. Successful stenting of the SVG to OM 2 with DES.  Echocardiogram:  Study Conclusions  - Left ventricle: The cavity size was normal. There was moderate   concentric hypertrophy. Systolic function was normal. The   estimated ejection fraction was in the range of 55% to 60%.   Hypokinesis of the basal-mid inferoseptal and basal inferior   myocardium. - Aortic valve: There was mild stenosis. There was no   regurgitation. Peak velocity (S): 257 cm/s. Mean gradient (S): 12   mm Hg. Valve area (VTI): 1.61 cm^2. Valve area (Vmax): 1.75 cm^2.   Valve area (Vmean): 1.8 cm^2. - Mitral valve: Transvalvular velocity was within the normal range.   There was no evidence for stenosis. There was no regurgitation. - Right ventricle: The cavity size was normal. Wall thickness was   normal. Systolic function was normal. - Tricuspid valve: There was mild regurgitation. - Pulmonary arteries: Systolic pressure was within the normal   range. PA peak pressure: 12 mm Hg (S).  Impressions:  - Wall motion appears unchanged from ec ho 10/17/10.  Assessment & Plan    1. NSTEMI: s/p LHC on 11/8. Extensive disease. Successful stenting of the SVG to OM 2 with DES.  Had severely elevated  LVEDP of 39 mm Hg. She was severely hypertensive during procedure. Started back on lisinopril 20 mg daily and Lasix 40 mg bid. Had 4.6 L output yesterday, net -3.1L. Pressures improved. ECHO with EF 55-60%. Moderate concentric hypertrophy. Hypokinesis of the basal-mid inferoseptal and basal inferior myocardium. PA peak pressure 12 mm Hg.  -DAPT, on plavix and  ASA -Lipitor 80 and Fenofibrate 54 mg daily -Decrease Lasix dose today -Lisinopril 20 mg daily -Metoprolol 25 mg dialy -Cardiac rehab phase I today  2. Premature multivessel CAD status post CABG in 2006 with redo operation in 2012. She has not had regular cardiology follow-up since that time. [facility] reported a small region of ischemia in the basal anterior and anterolateral walls with inferolateral attenuation.  3.Peripheral arterial disease status post aortobifemoral bypass grafting in 2006 with [facility]. She required thrombectomy at that time and also developed compartment syndrome that required surgical treatment.  4. Tobacco abuse in remission: patient denies smoking since 2012.  5. Chronic anemia: Hgb 10.5 today  6. Hyperlipidemia, Lipid panel with TG >500. On Atorvastatin 40 mg at home. Needs dietary and lifestyle modification. Direct LDL is 48. -Started fenofibrate  7. Recent abdominal discomfort: chronic, improved  8. Essential hypertension: Continue current medications  Signed, Valentino Noseathan Boswell PGY2 IM Resident [phone] 6:14 AM 08/23/2016   Attending Note:   The patient was seen and examined.  Agree with assessment and plan as noted above.  Changes made to the above note as needed.  Patient seen and independently examined with Valentino NoseNathan Boswell, PGY2 .   We discussed all aspects of the encounter. I agree with the assessment and plan as stated above.  1. CAD - s/p successful PCI of SVG to OM2. No CP  Continue Plavix  And ASA 81 lifelong  She needs  close follow up with her primary md I would be happy to see her if needed.   2. Hyperlipidema:   LDL is actually not bad.  She needs to greatly improve her diet and to exercise.  We started Fenofibrate 160  Follow up with her primary    3. HTN:   Needs to avoid salt  Follow up with primary    I have spent a total of 40 minutes with patient reviewing hospital  notes , telemetry, EKGs, labs and examining patient as well as establishing an assessment and plan that was discussed with the patient. > 50% of time was spent in direct patient care.    Vesta MixerPhilip J. Emilyrose Darrah, Montez HagemanJr., MD, [facility] 08/23/2016, 9:03 AM [address],  [address] Office [phone] Pager [phone]

## 2016-08-23 NOTE — Discharge Summary (Signed)
Discharge Summary    Patient ID: Ann Hawkins,  MRN: [id number], DOB/AGE: [birth year] 40 y.o.  Admit date: 08/20/2016 Discharge date: 08/24/2016  Primary Care Provider: No primary care provider on file. Primary Cardiologist: None  Discharge Diagnoses    Active Problems:   NSTEMI (non-ST elevated myocardial infarction) (HCC)   Allergies Allergies  Allergen Reactions  . Penicillins     Has patient had a PCN reaction causing immediate rash, facial/tongue/throat swelling, SOB or lightheadedness with hypotension: YES Has patient had a PCN reaction causing severe rash involving mucus membranes or skin necrosis: NO Has patient had a PCN reaction that required hospitalization NO Has patient had a PCN reaction occurring within the last 10 years: YES If all of the above answers are "NO", then may proceed with Cephalosporin use.  . Tramadol     Hives     Diagnostic Studies/Procedures    LHC  LV end diastolic pressure is severely elevated.  Ost LM to LM lesion, 100 %stenosed.  Ost 1st Mrg to 1st Mrg lesion, 100 %stenosed.  Ost Cx to Mid Cx lesion, 100 %stenosed.  Ost LAD lesion, 100 %stenosed.  Prox RCA to Dist RCA lesion, 100 %stenosed.  LIMA graft to the LAD was visualized by angiography and is large, very large and anatomically normal.  SVG graft to the PDA was not visualized due to occlusion.  Origin lesion, 100 %stenosed.  SVG graft to the OM 2 was visualized by angiography and is large.  Mid Graft to Dist Graft lesion, 90 %stenosed.  A STENT SYNERGY DES 3.5X12 drug eluting stent was successfully placed.  Post intervention, there is a 0% residual stenosis.   1. Native vessel occlusive disease involving the Left main, LAD, LCx, and RCA 2. Patent LIMA to the LAD 3. Patent SVG to OM 2 with 90% stenosis in distal SVG 4. Occluded SVG to PDA. The distal RCA branches fill by left to right and right to right collaterals. 5. Severely elevated LVEDP of 39 mm Hg 6.  Successful stenting of the SVG to OM 2 with DES.  Plan: DAPT indefinitely. Patient was severely hypertensive during procedure. Will resume lisinopril 20 mg daily. With severely elevated LV EDP she needs IV diuresis. Check LV function with Echo.   TTE Study Conclusions  - Left ventricle: The cavity size was normal. There was moderate   concentric hypertrophy. Systolic function was normal. The   estimated ejection fraction was in the range of 55% to 60%.   Hypokinesis of the basal-mid inferoseptal and basal inferior   myocardium. - Aortic valve: There was mild stenosis. There was no   regurgitation. Peak velocity (S): 257 cm/s. Mean gradient (S): 12   mm Hg. Valve area (VTI): 1.61 cm^2. Valve area (Vmax): 1.75 cm^2.   Valve area (Vmean): 1.8 cm^2. - Mitral valve: Transvalvular velocity was within the normal range.   There was no evidence for stenosis. There was no regurgitation. - Right ventricle: The cavity size was normal. Wall thickness was   normal. Systolic function was normal. - Tricuspid valve: There was mild regurgitation. - Pulmonary arteries: Systolic pressure was within the normal   range. PA peak pressure: 12 mm Hg (S).  Impressions:  - Wall motion appears unchanged from ec ho 10/17/10.  _____________   History of Present Illness     Ann Hawkins is a 40 year old female with an extensive cardiac history including premature multivessel CAD s/p CABG in 2006 with revision in 2012, PAD, DM  II, former smoker, strong family history of CAD, obesity, non-compliance presenting with 4 day history of chest pain, diaphoresis, nausea.   Hospital Course     Consultants: None    1. NSTEMI: s/p LHC on 11/8. Extensive disease. Successful stenting of the SVG to OM 2 with DES. Had severely elevated LVEDP of 39 mm Hg. She was severely hypertensive during procedure. Started back on lisinopril 20 mg daily and Lasix 40 mg bid. ECHO with EF 55-60%. Moderate concentric hypertrophy. Hypokinesis  of the basal-mid inferoseptal and basal inferior myocardium. PA peak pressure 12 mm Hg.  Started on Plavix 75 mg daily and ASA 81 mg daily. Will need lifelong DAPT. Needs close follow up with PCP. Would be happy to have her follow up with her if needed, however she has been only following up with PCP due to transportation issues.  2. HLD: LDL not actually that bad, 48. Has TG > 500. Needs to greatly improve her diet and exercise. Discussed during hospitalization. Started her on Fenofibrate 160 mg daily. Follow up with PCP.   3.Peripheral arterial disease status post aortobifemoral bypass grafting in 2006 with redo operation in 2016 at [facility]. She required thrombectomy at that time and also developed compartment syndrome that required surgical treatment.  4. Tobacco abuse in remission:patient denies smoking since 2012.  5. Essential hypertension: Needs to adhere to a low salt diet. Continued her home dose Lisinopril and metoprolol. Follow up with PCP.  _____________  Discharge Vitals Blood pressure (!) 132/109, pulse 86, temperature 98.2 F (36.8 C), temperature source Oral, resp. rate 10, height 5\' 1"  (1.549 m), weight 213 lb 3 oz (96.7 kg), last menstrual period 11/11/2010, SpO2 97 %.  Filed Weights   08/20/16 1933 08/21/16 0302  Weight: 217 lb 9.5 oz (98.7 kg) 213 lb 3 oz (96.7 kg)    Labs & Radiologic Studies     CBC  Recent Labs  08/22/16 0602 08/23/16 0207  WBC 5.3 7.4  HGB 8.8* 10.5*  HCT 27.6* 33.8*  MCV 91.1 90.9  PLT 217 264   Basic Metabolic Panel  Recent Labs  08/22/16 0602 08/23/16 0207  NA  --  136  K  --  3.7  CL  --  95*  CO2  --  27  GLUCOSE  --  133*  BUN  --  15  CREATININE 0.88 1.00  CALCIUM  --  10.2   Liver Function Tests No results for input(s): AST, ALT, ALKPHOS, BILITOT, PROT, ALBUMIN in the last 72 hours. No results for input(s): LIPASE, AMYLASE in the last 72 hours. Cardiac Enzymes No results for input(s): CKTOTAL, CKMB, CKMBINDEX,  TROPONINI in the last 72 hours. BNP Invalid input(s): POCBNP D-Dimer No results for input(s): DDIMER in the last 72 hours. Hemoglobin A1C No results for input(s): HGBA1C in the last 72 hours. Fasting Lipid Panel No results for input(s): CHOL, HDL, LDLCALC, TRIG, CHOLHDL, LDLDIRECT in the last 72 hours. Thyroid Function Tests No results for input(s): TSH, T4TOTAL, T3FREE, THYROIDAB in the last 72 hours.  Invalid input(s): FREET3  No results found.  Disposition   Pt is being discharged home today in good condition.  Follow-up Plans & Appointments     Discharge Instructions    AMB Referral to Cardiac Rehabilitation - Phase II    Complete by:  As directed    Diagnosis:  NSTEMI   Amb Referral to Cardiac Rehabilitation    Complete by:  As directed    referring to [facility] Phase [facility]  Diagnosis:   NSTEMI Coronary Stents     Diet - low sodium heart healthy    Complete by:  As directed    Diet - low sodium heart healthy    Complete by:  As directed    Increase activity slowly    Complete by:  As directed    Increase activity slowly    Complete by:  As directed       Discharge Medications   Discharge Medication List as of 08/23/2016  2:19 PM    START taking these medications   Details  fenofibrate 160 MG tablet Take 1 tablet (160 mg total) by mouth daily., Starting Fri 08/24/2016, Normal      CONTINUE these medications which have CHANGED   Details  atorvastatin (LIPITOR) 80 MG tablet Take 1 tablet (80 mg total) by mouth daily after supper., Starting Thu 08/23/2016, Normal    clopidogrel (PLAVIX) 75 MG tablet Take 1 tablet (75 mg total) by mouth daily., Starting Fri 08/24/2016, Normal      CONTINUE these medications which have NOT CHANGED   Details  aspirin 81 MG tablet Take 81 mg by mouth daily.  , Historical Med    Cholecalciferol (VITAMIN D3) 50000 units CAPS Take 1 tablet by mouth once a week. Take on Saturdays, Historical Med    furosemide (LASIX) 40 MG  tablet Take 1 tablet (40 mg total) by mouth daily., Starting Mon 02/11/2012, Normal    gabapentin (NEURONTIN) 600 MG tablet Take 1 tablet (600 mg total) by mouth 3 (three) times daily., Starting Mon 04/09/2011, Normal    insulin glargine (LANTUS) 100 UNIT/ML injection Inject 24 Units into the skin 2 (two) times daily., Starting Mon 04/09/2011, Normal    lisinopril (PRINIVIL,ZESTRIL) 20 MG tablet TAKE 1 TABLET BY MOUTH EVERY DAY, Normal    nitroGLYCERIN (NITROSTAT) 0.4 MG SL tablet Place 0.4 mg under the tongue every 5 (five) minutes as needed for chest pain. Given by EMS couple days ago. Patient states she has the nitro spray at home and it helps better for her, Historical Med    oxyCODONE (ROXICODONE) 15 MG immediate release tablet Take 15 mg by mouth 3 (three) times daily., Historical Med    potassium chloride (KLOR-CON) 10 MEQ CR tablet Take 2 tablets (20 mEq total) by mouth daily., Starting Mon 04/09/2011, Normal    zolpidem (AMBIEN) 10 MG tablet Take 1 tablet (10 mg total) by mouth at bedtime as needed., Starting Mon 04/09/2011, Print    metoprolol (TOPROL XL) 100 MG 24 hr tablet Take 1 tablet (100 mg total) by mouth daily., Starting 10/11/2011, Until Fri 10/10/12, Normal      STOP taking these medications     nitroGLYCERIN (NITROLINGUAL) 0.4 MG/SPRAY spray      warfarin (COUMADIN) 7.5 MG tablet          Aspirin prescribed at discharge?  Yes High Intensity Statin Prescribed? (Lipitor 40-80mg  or Crestor 20-40mg ): Yes Beta Blocker Prescribed? Yes For EF 45% or less, Was ACEI/ARB Prescribed? Yes ADP Receptor Inhibitor Prescribed? (i.e. Plavix etc.-Includes Medically Managed Patients): Yes For EF <40%, Aldosterone Inhibitor Prescribed? No: EF 55-60% Was EF assessed during THIS hospitalization? Yes Was Cardiac Rehab II ordered? (Included Medically managed Patients): Yes   Outstanding Labs/Studies   None  Duration of Discharge Encounter   Greater than 30 minutes including  physician time.  Signed, Valentino Nose PGY2 IM Resident 08/24/2016, 4:45 PM  Attending Note:   The patient was seen and examined.  Agree with assessment  and plan as noted above.  Changes made to the above note as needed.  Patient seen and independently examined with Valentino NoseNathan Boswell, PGY2.   We discussed all aspects of the encounter. I agree with the assessment and plan as stated above.  1. CAD - doing well after her PCI. Needs aggressive lipid management She will follow up with her primary and we are available to see her as well if she would like    I have spent a total of 40 minutes with patient reviewing hospital  notes , telemetry, EKGs, labs and examining patient as well as establishing an assessment and plan that was discussed with the patient. > 50% of time was spent in direct patient care.    Vesta MixerPhilip J. Lavada Langsam, Montez HagemanJr., MD, [facility] 08/24/2016, 4:57 PM [address],  [address] Office [phone] Pager [phone]

## 2016-08-24 ENCOUNTER — Telehealth: Payer: Self-pay | Admitting: Cardiology

## 2016-08-24 NOTE — Telephone Encounter (Signed)
Returned call to patient directly. She stated she wanted some pain medication because her cats are jumping on the area of her left groin where they did the heart catheterization. Her future mother-in-law looked at the site, as the patient said she could not see it, and they noted a "tiny bit of redness and bruising."  Educated patient that she should avoid the cats if they are doing that in order to help the healing process.  Also, reminded her to not lift, push, or pull anything greater than 10lb for one week and to avoid strenous activity for 1 week. Encouraged patient to go ahead and make an appt with her PCP as it is stated in the discharge summary to do for follow up. Patient stated she could not drive and only one person in the household could drive but they did not have a car. She usually arranges for someone to take her to places. She verbalized understanding and would make an appt with PCP soon.  Denies chest pain at this time.

## 2016-08-24 NOTE — Telephone Encounter (Signed)
New message      Pt had a heart cath recently.  She is c/o groin pain and chest pain.  Patient want to know if she can have something for pain.  Please call

## 2016-08-26 ENCOUNTER — Telehealth: Payer: Self-pay | Admitting: Cardiovascular Disease

## 2016-08-26 NOTE — Telephone Encounter (Signed)
   [name] called this morning to report SOB over the past 48H, now becoming dyspneic when walking only a few feet.  Her significant other has an upper respiratory infection with cough and sinus congestion, and [name] has developed these symptoms too.  She also notes that her significant other and another friend have been smoking around her since she was discharged, including in their trailer.  She denies chest pain, either at rest or with exertion.  Given that her dyspnea is worsening, and that she required intermittent suppelmental oxygen during her recent admission, I suggested that she present to the closest emergency department for evaluation.  She expressed understanding.  [name] S. Asbury Hair MD

## 2017-03-19 ENCOUNTER — Telehealth: Payer: Self-pay | Admitting: Cardiology

## 2017-03-19 NOTE — Telephone Encounter (Signed)
Nurse from [facility] asking for follow up appointment post hospital. Follow up scheduled

## 2017-03-19 NOTE — Telephone Encounter (Signed)
New Message   [facility] because she needs to know if pt is to continue or discontinue Levenox. Requests a call back.

## 2017-03-19 NOTE — Telephone Encounter (Signed)
Left message for [facility] to call

## 2017-03-29 ENCOUNTER — Encounter: Payer: Self-pay | Admitting: *Deleted

## 2017-03-29 ENCOUNTER — Ambulatory Visit: Payer: Medicaid Other | Admitting: Physician Assistant

## 2017-03-29 NOTE — Progress Notes (Deleted)
Cardiology Office Note    Date:  03/29/2017   ID:  Ann Hawkins, DOB Feb 22, 1976, MRN [id number]  PCP:  Patient, No Pcp Per  Cardiologist:  Dr. Kirke Corin in 2012   No chief complaint on file.   History of Present Illness:  Ann Hawkins is a 41 y.o. female with PMH of Bilateral carotid artery disease, CAD s/p CABG in 2006 with a redo in 2012, hypertension, hyperlipidemia, PAD with aortobifemoral bypass in 2006 and also a redo in 2016, DM 2. He had a history of hepatomegaly on CT scan consistent with hepatic steatosis. She was lost between 2012 and 2016. She was admitted in the hospital on 08/20/2016 with chest pain, troponin was mildly elevated between 0.7 and a 0.8 at outside hospital. Cardiac catheterization at the time showed patent LIMA to LAD, patent SVG to OM 2 with 90% stenosis in distal SVG, occluded SVG to PDA with distal RCA supplied by left-to-right and the right to right collaterals, she underwent stenting of SVG to OM 2 with DES, LVEDP was elevated at 39 mmHg. Echocardiogram showed moderate LV H, EF 55-60%, hypokinesis of the basal mid inferoseptal and basal inferior myocardium, mild AI, mild TR, PA peak pressure 12 mmHg. It appears since the last time we saw her in November, she has went [facility] at least [facility] times and was admitted once. In January, she developed acute kidney injury with creatinine of 2.7 on initial presentation, her renal insufficiency resolved with IV fluids. CT of the abdomen and pelvis did not show any obstruction despite abdominal discomfort. She had a repeat myocardial perfusion scan on 12/28/2016 which was negative for ischemia, mid to basal inferolateral wall fixed defect unchanged since 2015, septal hypokinesis with preserved LV ejection fraction.  Yes EKG   Past Medical History:  Diagnosis Date  . Anemia   . Carotid artery occlusion    Bilateral moderate carotid stenosis  . Compartment syndrome of lower extremity  (HCC) 2016   In setting PAD with thrombectomy and redo aortobifemoral bypass at [facility]  . Coronary artery disease    Multivessel s/p CABG in 2006 with redo 2012  . Essential hypertension   . GERD (gastroesophageal reflux disease)   . Hiatal hernia   . Hyperlipidemia   . MI, acute, non ST segment elevation [facility]) Jan. 2012  . Obesity   . Peripheral arterial disease (HCC) 2006   Aortobifemoral bypass surgery in 2006 with redo 2016 [facility])  . Problem with medical care compliance   . Type 2 diabetes mellitus (HCC)   . Ulcer of esophagus with bleeding     Past Surgical History:  Procedure Laterality Date  . ABDOMINAL HYSTERECTOMY    . Aortobifemoral bypass  2016   Redo procedure [facility]  . CARDIAC CATHETERIZATION  10/2010   Occluded native coronary arteries. Patent LIMA to LAD. Occluded SVG to RCA,. SVG to OM: 90%distal ISR  . CARDIAC CATHETERIZATION N/A 08/22/2016   Procedure: Left Heart Cath and Cors/Grafts Angiography;  Surgeon: Peter M Swaziland, MD;  Location: [facility] INVASIVE CV LAB;  Service: Cardiovascular;  Laterality: N/A;  . CARDIAC CATHETERIZATION N/A 08/22/2016   Procedure: Coronary Stent Intervention;  Surgeon: Peter M Swaziland, MD;  Location: [facility] INVASIVE CV LAB;  Service: Cardiovascular;  Laterality: N/A;  . CORONARY ANGIOPLASTY WITH STENT PLACEMENT     Multiple PCI on SVGs before redo.   . CORONARY ARTERY BYPASS GRAFT  2006, 2012   Redo: SVG to OM and SVG  to RPDA (very poor target, likely nonfunctional). Not a redo candidate  . HERNIA REPAIR    . HIP SURGERY      Current Medications: Outpatient Medications Prior to Visit  Medication Sig Dispense Refill  . aspirin 81 MG tablet Take 81 mg by mouth daily.      Marland Kitchen. atorvastatin (LIPITOR) 80 MG tablet Take 1 tablet (80 mg total) by mouth daily after supper. 30 tablet 2  . Cholecalciferol (VITAMIN D3) 50000 units CAPS Take 1 tablet by mouth once a week. Take on Saturdays    . clopidogrel (PLAVIX) 75 MG tablet Take 1 tablet (75 mg  total) by mouth daily. 30 tablet 2  . fenofibrate 160 MG tablet Take 1 tablet (160 mg total) by mouth daily. 30 tablet 2  . furosemide (LASIX) 40 MG tablet Take 1 tablet (40 mg total) by mouth daily. (Patient taking differently: Take 40 mg by mouth 2 (two) times daily. ) 30 tablet 0  . gabapentin (NEURONTIN) 600 MG tablet Take 1 tablet (600 mg total) by mouth 3 (three) times daily. 90 tablet 0  . insulin glargine (LANTUS) 100 UNIT/ML injection Inject 24 Units into the skin 2 (two) times daily. 10 mL 0  . lisinopril (PRINIVIL,ZESTRIL) 20 MG tablet TAKE 1 TABLET BY MOUTH EVERY DAY 30 tablet 7  . metoprolol (TOPROL XL) 100 MG 24 hr tablet Take 1 tablet (100 mg total) by mouth daily. 30 tablet 6  . nitroGLYCERIN (NITROSTAT) 0.4 MG SL tablet Place 0.4 mg under the tongue every 5 (five) minutes as needed for chest pain. Given by EMS couple days ago. Patient states she has the nitro spray at home and it helps better for her    . oxyCODONE (ROXICODONE) 15 MG immediate release tablet Take 15 mg by mouth 3 (three) times daily.    . potassium chloride (KLOR-CON) 10 MEQ CR tablet Take 2 tablets (20 mEq total) by mouth daily. 60 tablet 6  . zolpidem (AMBIEN) 10 MG tablet Take 1 tablet (10 mg total) by mouth at bedtime as needed. (Patient taking differently: Take 10 mg by mouth at bedtime as needed for sleep. ) 30 tablet 0   No facility-administered medications prior to visit.      Allergies:   Penicillins and Tramadol   Social History   Social History  . Marital status: Single    Spouse name: N/A  . Number of children: N/A  . Years of education: N/A   Social History Main Topics  . Smoking status: Former Smoker    Packs/day: 1.50    Years: 12.00    Types: Cigarettes    Quit date: 11/08/2010  . Smokeless tobacco: Never Used  . Alcohol use No  . Drug use: No  . Sexual activity: No   Other Topics Concern  . Not on file   Social History Narrative  . No narrative on file     Family History:   The patient's ***family history includes Heart disease in her father and mother; Stroke in her mother.   ROS:   Please see the history of present illness.    ROS All other systems reviewed and are negative.   PHYSICAL EXAM:   VS:  LMP 11/11/2010    GEN: Well nourished, well developed, in no acute distress  HEENT: normal  Neck: no JVD, carotid bruits, or masses Cardiac: ***RRR; no murmurs, rubs, or gallops,no edema  Respiratory:  clear to auscultation bilaterally, normal work of breathing GI: soft, nontender, nondistended, +  BS MS: no deformity or atrophy  Skin: warm and dry, no rash Neuro:  Alert and Oriented x 3, Strength and sensation are intact Psych: euthymic mood, full affect  Wt Readings from Last 3 Encounters:  08/21/16 213 lb 3 oz (96.7 kg)  04/09/11 187 lb (84.8 kg)  10/17/10 175 lb (79.4 kg)      Studies/Labs Reviewed:   EKG:  EKG is*** ordered today.  The ekg ordered today demonstrates ***  Recent Labs: 08/23/2016: BUN 15; Creatinine, Ser 1.00; Hemoglobin 10.5; Platelets 264; Potassium 3.7; Sodium 136   Lipid Panel    Component Value Date/Time   CHOL 152 08/21/2016 0155   TRIG 593 (H) 08/21/2016 0155   HDL 40 (L) 08/21/2016 0155   CHOLHDL 3.8 08/21/2016 0155   VLDL UNABLE TO CALCULATE IF TRIGLYCERIDE OVER 400 mg/dL 16/07/9603 5409   LDLCALC UNABLE TO CALCULATE IF TRIGLYCERIDE OVER 400 mg/dL 81/19/1478 2956   LDLDIRECT 48 08/21/2016 0821    Additional studies/ records that were reviewed today include:   Cath 08/22/2016 Conclusion     LV end diastolic pressure is severely elevated.  Ost LM to LM lesion, 100 %stenosed.  Ost 1st Mrg to 1st Mrg lesion, 100 %stenosed.  Ost Cx to Mid Cx lesion, 100 %stenosed.  Ost LAD lesion, 100 %stenosed.  Prox RCA to Dist RCA lesion, 100 %stenosed.  LIMA graft to the LAD was visualized by angiography and is large, very large and anatomically normal.  SVG graft to the PDA was not visualized due to  occlusion.  Origin lesion, 100 %stenosed.  SVG graft to the OM 2 was visualized by angiography and is large.  Mid Graft to Dist Graft lesion, 90 %stenosed.  A STENT SYNERGY DES 3.5X12 drug eluting stent was successfully placed.  Post intervention, there is a 0% residual stenosis.   1. Native vessel occlusive disease involving the Left main, LAD, LCx, and RCA 2. Patent LIMA to the LAD 3. Patent SVG to OM 2 with 90% stenosis in distal SVG 4. Occluded SVG to PDA. The distal RCA branches fill by left to right and right to right collaterals. 5. Severely elevated LVEDP of 39 mm Hg 6. Successful stenting of the SVG to OM 2 with DES.  Plan: DAPT indefinitely. Patient was severely hypertensive during procedure. Will resume lisinopril 20 mg daily. With severely elevated LV EDP she needs IV diuresis. Check LV function with Echo.     Echo 08/22/2016 LV EF: 55% -   60%  ------------------------------------------------------------------- Study Conclusions  - Left ventricle: The cavity size was normal. There was moderate   concentric hypertrophy. Systolic function was normal. The   estimated ejection fraction was in the range of 55% to 60%.   Hypokinesis of the basal-mid inferoseptal and basal inferior   myocardium. - Aortic valve: There was mild stenosis. There was no   regurgitation. Peak velocity (S): 257 cm/s. Mean gradient (S): 12   mm Hg. Valve area (VTI): 1.61 cm^2. Valve area (Vmax): 1.75 cm^2.   Valve area (Vmean): 1.8 cm^2. - Mitral valve: Transvalvular velocity was within the normal range.   There was no evidence for stenosis. There was no regurgitation. - Right ventricle: The cavity size was normal. Wall thickness was   normal. Systolic function was normal. - Tricuspid valve: There was mild regurgitation. - Pulmonary arteries: Systolic pressure was within the normal   range. PA peak pressure: 12 mm Hg (S).  Impressions:  - Wall motion appears unchanged from ec ho  10/17/10.  Myoview 12/28/2016 No substantial inducible ischemia. Mid to basal inferolateral wall fixed defect, similar to 02/03/2014. Septal hypokinesis with preserved left ventricular ejection fraction.  ASSESSMENT:    No diagnosis found.   PLAN:  In order of problems listed above:  1. ***    Medication Adjustments/Labs and Tests Ordered: Current medicines are reviewed at length with the patient today.  Concerns regarding medicines are outlined above.  Medication changes, Labs and Tests ordered today are listed in the Patient Instructions below. There are no Patient Instructions on file for this visit.   Ramond Dial, [place]  03/29/2017 6:47 AM    [facility] [address] [place], [place], [place]  [address] Phone: [phone]; Fax: [phone]

## 2017-04-16 ENCOUNTER — Encounter: Payer: Self-pay | Admitting: *Deleted

## 2017-04-16 ENCOUNTER — Ambulatory Visit: Payer: Medicaid Other | Admitting: Physician Assistant

## 2017-04-16 NOTE — Progress Notes (Deleted)
Cardiology Office Note    Date:  04/16/2017   ID:  Ann Hawkins, DOB May 22, 1976, MRN [id number]  PCP:  Ann Hawkins  Cardiologist:  Remotely Dr. Kirke Corin  No chief complaint on file.   History of Present Illness:  Ann Hawkins is a 41 y.o. female with PMH of bilateral carotid artery disease, HTN, GERD, HLD, PAD s/p aortobifemoral bypass 2006 with redo 2016, DM II and CAD s/p CABG 2006 with redo 2012. She was admitted for NSTEMI in November 2017, she underwent cardiac catheterization which showed patent LIMA to LAD, patent SVG to OM 2 with 90% stenosis in distal SVG treated with drug-eluting stent, occluded SVG to PDA, with distal RCA branch filled with left-to-right and right to right collaterals. She has known occluded left main, ostial OM 2, ostial left circumflex, ostial LAD, and proximal RCA. Echocardiogram obtained recently on 08/22/2016 showed EF 55-60%, mild AI, mild TR.  Yes EKG    Past Medical History:  Diagnosis Date  . Anemia   . Carotid artery occlusion    Bilateral moderate carotid stenosis  . Compartment syndrome of lower extremity (HCC) 2016   In setting PAD with thrombectomy and redo aortobifemoral bypass at [facility]  . Coronary artery disease    Multivessel s/p CABG in 2006 with redo 2012  . Essential hypertension   . GERD (gastroesophageal reflux disease)   . Hiatal hernia   . Hyperlipidemia   . MI, acute, non ST segment elevation [facility]) Jan. 2012  . Obesity   . Peripheral arterial disease (HCC) 2006   Aortobifemoral bypass surgery in 2006 with redo 2016 [facility])  . Problem with medical care compliance   . Type 2 diabetes mellitus (HCC)   . Ulcer of esophagus with bleeding     Past Surgical History:  Procedure Laterality Date  . ABDOMINAL HYSTERECTOMY    . Aortobifemoral bypass  2016   Redo procedure [facility]  . CARDIAC CATHETERIZATION  10/2010   Occluded native coronary arteries. Patent LIMA to LAD. Occluded SVG to RCA,. SVG to OM: 90%distal ISR  .  CARDIAC CATHETERIZATION N/A 08/22/2016   Procedure: Left Heart Cath and Cors/Grafts Angiography;  Surgeon: Peter M Swaziland, MD;  Location: [facility] INVASIVE CV LAB;  Service: Cardiovascular;  Laterality: N/A;  . CARDIAC CATHETERIZATION N/A 08/22/2016   Procedure: Coronary Stent Intervention;  Surgeon: Peter M Swaziland, MD;  Location: [facility] INVASIVE CV LAB;  Service: Cardiovascular;  Laterality: N/A;  . CORONARY ANGIOPLASTY WITH STENT PLACEMENT     Multiple PCI on SVGs before redo.   . CORONARY ARTERY BYPASS GRAFT  2006, 2012   Redo: SVG to OM and SVG to RPDA (very poor target, likely nonfunctional). Not a redo candidate  . HERNIA REPAIR    . HIP SURGERY      Current Medications: Outpatient Medications Prior to Visit  Medication Sig Dispense Refill  . aspirin 81 MG tablet Take 81 mg by mouth daily.      Marland Kitchen atorvastatin (LIPITOR) 80 MG tablet Take 1 tablet (80 mg total) by mouth daily after supper. 30 tablet 2  . Cholecalciferol (VITAMIN D3) 50000 units CAPS Take 1 tablet by mouth once a week. Take on Saturdays    . clopidogrel (PLAVIX) 75 MG tablet Take 1 tablet (75 mg total) by mouth daily. 30 tablet 2  . fenofibrate 160 MG tablet Take 1 tablet (160 mg total) by mouth daily. 30 tablet 2  . furosemide (LASIX) 40 MG tablet Take 1 tablet (40 mg total)  by mouth daily. (Patient taking differently: Take 40 mg by mouth 2 (two) times daily. ) 30 tablet 0  . gabapentin (NEURONTIN) 600 MG tablet Take 1 tablet (600 mg total) by mouth 3 (three) times daily. 90 tablet 0  . insulin glargine (LANTUS) 100 UNIT/ML injection Inject 24 Units into the skin 2 (two) times daily. 10 mL 0  . lisinopril (PRINIVIL,ZESTRIL) 20 MG tablet TAKE 1 TABLET BY MOUTH EVERY DAY 30 tablet 7  . metoprolol (TOPROL XL) 100 MG 24 hr tablet Take 1 tablet (100 mg total) by mouth daily. 30 tablet 6  . nitroGLYCERIN (NITROSTAT) 0.4 MG SL tablet Place 0.4 mg under the tongue every 5 (five) minutes as needed for chest pain. Given by EMS couple days  ago. Patient states she has the nitro spray at home and it helps better for her    . oxyCODONE (ROXICODONE) 15 MG immediate release tablet Take 15 mg by mouth 3 (three) times daily.    . potassium chloride (KLOR-CON) 10 MEQ CR tablet Take 2 tablets (20 mEq total) by mouth daily. 60 tablet 6  . zolpidem (AMBIEN) 10 MG tablet Take 1 tablet (10 mg total) by mouth at bedtime as needed. (Patient taking differently: Take 10 mg by mouth at bedtime as needed for sleep. ) 30 tablet 0   No facility-administered medications prior to visit.      Allergies:   Penicillins and Tramadol   Social History   Social History  . Marital status: Single    Spouse name: N/A  . Number of children: N/A  . Years of education: N/A   Social History Main Topics  . Smoking status: Former Smoker    Packs/day: 1.50    Years: 12.00    Types: Cigarettes    Quit date: 11/08/2010  . Smokeless tobacco: Never Used  . Alcohol use No  . Drug use: No  . Sexual activity: No   Other Topics Concern  . Not on file   Social History Narrative  . No narrative on file     Family History:  The patient's ***family history includes Heart disease in her father and mother; Stroke in her mother.   ROS:   Please see the history of present illness.    ROS All other systems reviewed and are negative.   PHYSICAL EXAM:   VS:  LMP 11/11/2010    GEN: Well nourished, well developed, in no acute distress  HEENT: normal  Neck: no JVD, carotid bruits, or masses Cardiac: ***RRR; no murmurs, rubs, or gallops,no edema  Respiratory:  clear to auscultation bilaterally, normal work of breathing GI: soft, nontender, nondistended, + BS MS: no deformity or atrophy  Skin: warm and dry, no rash Neuro:  Alert and Oriented x 3, Strength and sensation are intact Psych: euthymic mood, full affect  Wt Readings from Last 3 Encounters:  08/21/16 213 lb 3 oz (96.7 kg)  04/09/11 187 lb (84.8 kg)  10/17/10 175 lb (79.4 kg)      Studies/Labs  Reviewed:   EKG:  EKG is*** ordered today.  The ekg ordered today demonstrates ***  Recent Labs: 08/23/2016: BUN 15; Creatinine, Ser 1.00; Hemoglobin 10.5; Platelets 264; Potassium 3.7; Sodium 136   Lipid Panel    Component Value Date/Time   CHOL 152 08/21/2016 0155   TRIG 593 (H) 08/21/2016 0155   HDL 40 (L) 08/21/2016 0155   CHOLHDL 3.8 08/21/2016 0155   VLDL UNABLE TO CALCULATE IF TRIGLYCERIDE OVER 400 mg/dL 16/10/960411/04/2016 [id number]  LDLCALC UNABLE TO CALCULATE IF TRIGLYCERIDE OVER 400 mg/dL 40/98/1191 4782   LDLDIRECT 48 08/21/2016 0821    Additional studies/ records that were reviewed today include:   Cath 08/22/2016 Conclusion     LV end diastolic pressure is severely elevated.  Ost LM to LM lesion, 100 %stenosed.  Ost 1st Mrg to 1st Mrg lesion, 100 %stenosed.  Ost Cx to Mid Cx lesion, 100 %stenosed.  Ost LAD lesion, 100 %stenosed.  Prox RCA to Dist RCA lesion, 100 %stenosed.  LIMA graft to the LAD was visualized by angiography and is large, very large and anatomically normal.  SVG graft to the PDA was not visualized due to occlusion.  Origin lesion, 100 %stenosed.  SVG graft to the OM 2 was visualized by angiography and is large.  Mid Graft to Dist Graft lesion, 90 %stenosed.  A STENT SYNERGY DES 3.5X12 drug eluting stent was successfully placed.  Post intervention, there is a 0% residual stenosis.   1. Native vessel occlusive disease involving the Left main, LAD, LCx, and RCA 2. Patent LIMA to the LAD 3. Patent SVG to OM 2 with 90% stenosis in distal SVG 4. Occluded SVG to PDA. The distal RCA branches fill by left to right and right to right collaterals. 5. Severely elevated LVEDP of 39 mm Hg 6. Successful stenting of the SVG to OM 2 with DES.  Plan: DAPT indefinitely. Patient was severely hypertensive during procedure. Will resume lisinopril 20 mg daily. With severely elevated LV EDP she needs IV diuresis. Check LV function with Echo.        Echo  08/22/2016 LV EF: 55% -   60%  Study Conclusions  - Left ventricle: The cavity size was normal. There was moderate   concentric hypertrophy. Systolic function was normal. The   estimated ejection fraction was in the range of 55% to 60%.   Hypokinesis of the basal-mid inferoseptal and basal inferior   myocardium. - Aortic valve: There was mild stenosis. There was no   regurgitation. Peak velocity (S): 257 cm/s. Mean gradient (S): 12   mm Hg. Valve area (VTI): 1.61 cm^2. Valve area (Vmax): 1.75 cm^2.   Valve area (Vmean): 1.8 cm^2. - Mitral valve: Transvalvular velocity was within the normal range.   There was no evidence for stenosis. There was no regurgitation. - Right ventricle: The cavity size was normal. Wall thickness was   normal. Systolic function was normal. - Tricuspid valve: There was mild regurgitation. - Pulmonary arteries: Systolic pressure was within the normal   range. PA peak pressure: 12 mm Hg (S).  Impressions:  - Wall motion appears unchanged from ec ho 10/17/10.   ASSESSMENT:    No diagnosis found.   PLAN:  In order of problems listed above:  1. ***    Medication Adjustments/Labs and Tests Ordered: Current medicines are reviewed at length with the patient today.  Concerns regarding medicines are outlined above.  Medication changes, Labs and Tests ordered today are listed in the Patient Instructions below. There are no Patient Instructions on file for this visit.   Ramond Dial, [place]  04/16/2017 8:05 AM    [facility] [address] [place], [place], [place]  [address] Phone: [phone]; Fax: [phone]

## 2022-02-21 DIAGNOSIS — I350 Nonrheumatic aortic (valve) stenosis: Secondary | ICD-10-CM | POA: Diagnosis not present

## 2022-02-23 DIAGNOSIS — I10 Essential (primary) hypertension: Secondary | ICD-10-CM | POA: Diagnosis not present

## 2022-02-23 DIAGNOSIS — I429 Cardiomyopathy, unspecified: Secondary | ICD-10-CM | POA: Diagnosis not present

## 2022-02-23 DIAGNOSIS — I2510 Atherosclerotic heart disease of native coronary artery without angina pectoris: Secondary | ICD-10-CM | POA: Diagnosis not present

## 2022-02-23 DIAGNOSIS — F1110 Opioid abuse, uncomplicated: Secondary | ICD-10-CM | POA: Diagnosis not present

## 2022-02-24 DIAGNOSIS — I10 Essential (primary) hypertension: Secondary | ICD-10-CM | POA: Diagnosis not present

## 2022-02-24 DIAGNOSIS — I429 Cardiomyopathy, unspecified: Secondary | ICD-10-CM | POA: Diagnosis not present

## 2022-02-24 DIAGNOSIS — F1110 Opioid abuse, uncomplicated: Secondary | ICD-10-CM | POA: Diagnosis not present

## 2022-02-24 DIAGNOSIS — I2510 Atherosclerotic heart disease of native coronary artery without angina pectoris: Secondary | ICD-10-CM | POA: Diagnosis not present

## 2022-02-25 DIAGNOSIS — I10 Essential (primary) hypertension: Secondary | ICD-10-CM | POA: Diagnosis not present

## 2022-02-25 DIAGNOSIS — F1110 Opioid abuse, uncomplicated: Secondary | ICD-10-CM | POA: Diagnosis not present

## 2022-02-25 DIAGNOSIS — I2510 Atherosclerotic heart disease of native coronary artery without angina pectoris: Secondary | ICD-10-CM | POA: Diagnosis not present

## 2022-02-25 DIAGNOSIS — I429 Cardiomyopathy, unspecified: Secondary | ICD-10-CM | POA: Diagnosis not present

## 2022-02-26 DIAGNOSIS — F1110 Opioid abuse, uncomplicated: Secondary | ICD-10-CM | POA: Diagnosis not present

## 2022-02-26 DIAGNOSIS — I10 Essential (primary) hypertension: Secondary | ICD-10-CM | POA: Diagnosis not present

## 2022-02-26 DIAGNOSIS — I2510 Atherosclerotic heart disease of native coronary artery without angina pectoris: Secondary | ICD-10-CM | POA: Diagnosis not present

## 2022-02-26 DIAGNOSIS — I429 Cardiomyopathy, unspecified: Secondary | ICD-10-CM | POA: Diagnosis not present

## 2022-03-15 DEATH — deceased
